# Patient Record
Sex: Female | Born: 1975 | Race: White | Hispanic: No | Smoking: Former smoker
Health system: Southern US, Community
[De-identification: ages and names within clinical notes are randomized; demographics above are authoritative.]

## PROBLEM LIST (undated history)

## (undated) DIAGNOSIS — E669 Obesity, unspecified: Secondary | ICD-10-CM

## (undated) DIAGNOSIS — R7303 Prediabetes: Secondary | ICD-10-CM

## (undated) HISTORY — DX: Obesity, unspecified: E66.9

## (undated) HISTORY — PX: CHOLECYSTECTOMY: SHX55

---

## 2013-01-15 HISTORY — PX: HEMORRHOID SURGERY: SHX153

## 2018-06-04 ENCOUNTER — Other Ambulatory Visit (HOSPITAL_COMMUNITY): Payer: Self-pay | Admitting: General Surgery

## 2018-06-04 ENCOUNTER — Other Ambulatory Visit: Payer: Self-pay | Admitting: General Surgery

## 2018-06-11 ENCOUNTER — Other Ambulatory Visit (HOSPITAL_COMMUNITY): Payer: Self-pay | Admitting: General Surgery

## 2018-06-11 ENCOUNTER — Ambulatory Visit (HOSPITAL_COMMUNITY)
Admission: RE | Admit: 2018-06-11 | Discharge: 2018-06-11 | Disposition: A | Discharge status: Home / Self Care | Payer: BLUE CROSS/BLUE SHIELD | Source: Ambulatory Visit | Attending: General Surgery | Admitting: General Surgery

## 2018-06-11 ENCOUNTER — Other Ambulatory Visit: Payer: Self-pay

## 2018-06-26 ENCOUNTER — Encounter: Payer: Self-pay | Admitting: Dietician

## 2018-06-26 ENCOUNTER — Other Ambulatory Visit: Payer: Self-pay

## 2018-06-26 ENCOUNTER — Encounter: Payer: BC Managed Care – PPO | Attending: General Surgery | Admitting: Dietician

## 2018-06-26 DIAGNOSIS — E669 Obesity, unspecified: Secondary | ICD-10-CM | POA: Insufficient documentation

## 2018-06-26 NOTE — Progress Notes (Signed)
Bariatric Pre-Op Nutrition Assessment Medical Nutrition Therapy  Appt Start Time: 7:30am  End time: 8:30am  Patient was seen on 06/26/2018 for Pre-Operative Nutrition Assessment. Assessment and letter of approval faxed to [facility] Surgery Bariatric Surgery Program coordinator on 06/26/2018.   Planned surgery: Sleeve Gastrectomy Pt expectation of surgery: to maintain weight that is lost  Pt expectation of dietitian: none stated  Anthropometrics  Start weight at [facility]: 265.4 lbs (date: 06/26/2018) Height: 62 in BMI: 48.54 kg/m2    Clinical  Medical Hx: obesity Surgeries: c-section, cholecystectomy, hemorrhoidectomy, oral surgery  Medications: N/A Allergies: N/A  Psychosocial/Lifestyle Former smoker, quit <2 weeks ago. Lives with her 90 year old daughter and 75 year old adopted daughter. Considered weight loss surgery last year as well.   24-Hr Dietary Recall First Meal: Chex mix (or leftovers)  Snack: nuts + chips (or cookie)  Second Meal: sandwich + fruit + chips  Snack: Cheetos  Third Meal: chicken + rice/potato + vegetable  Snack: ice cream (or dessert)  Beverages: Crystal Light tea, diet Mtn Dew   Food & Nutrition Related Hx Dietary Hx: Drinks almond milk instead of cow's. Not a breakfast food person. Snacks a lot throughout the day. Estimated Daily Fluid Intake: 64 oz Supplements: vitamin D  GI / Other Notable Symptoms: none stated  Physical Activity  Current average weekly physical activity: walking 20 mins/day   Estimated Energy Needs Calories: 1800 Carbohydrate: 200g Protein: 113g Fat: 60g  Pre-Op Goals Reviewed with the Patient . Track food and beverage intake (try MyFitness Pal or the Baritastic app) . Make healthy food choices while monitoring portion sizes . Consume 3 meals per day or try to eat every 3-5 hours . Avoid concentrated sugars and fried foods . Keep sugar & fat in the single digits per serving on food labels . Practice CHEWING your food  (aim for applesauce consistency) . Practice not drinking 15 minutes before, during, and 30 minutes after each meal and snack . Avoid all carbonated beverages (ex: soda, sparkling beverages)  . Limit caffeinated beverages (ex: coffee, tea, energy drinks) . Avoid all sugar-sweetened beverages (ex: regular soda, sports drinks)  . Avoid alcohol  . Aim for 64-100 ounces of FLUID daily (with at least half of fluid intake being plain water)  . Aim for at least 60-80 grams of PROTEIN daily . Look for a liquid protein source that contains ?15 g protein and ?5 g carbohydrate (ex: shakes, drinks, shots) . Make a list of non-food related activities . Physical activity is an important part of a healthy lifestyle so keep it moving! The goal is to reach 150 minutes of exercise per week, including cardiovascular and weight baring activity.  Handouts Provided Include  . Bariatric Surgery handouts (Nutrition Visits, Pre-Op Goals, Protein Shakes, Vitamins & Minerals)  Learning Style & Readiness for Change Teaching method utilized: Visual & Auditory  Demonstrated degree of understanding via: Teach Back  Barriers to learning/adherence to lifestyle change: None Identified   Next Steps Supervised Weight Loss (SWL) Visits Needed: 0  Patient is to call [facility] to enroll in Pre-Op Class (>2 weeks before surgery) and Post-Op Class (2 weeks after surgery) for further nutrition education when surgery date is scheduled.

## 2018-06-26 NOTE — Patient Instructions (Signed)
It was nice meeting you today! Begin working through the [employer] we discussed today. Start with the 1 or 2 goals you expect will be the hardest to work on first for the next couple of weeks. Then move towards working through other goals.   We will see you at Pre-Op Class 2 weeks before your surgery! Take care

## 2018-07-28 ENCOUNTER — Ambulatory Visit: Payer: BC Managed Care – PPO

## 2018-08-04 ENCOUNTER — Encounter: Payer: BC Managed Care – PPO | Attending: General Surgery | Admitting: Skilled Nursing Facility1

## 2018-08-04 ENCOUNTER — Other Ambulatory Visit: Payer: Self-pay

## 2018-08-04 DIAGNOSIS — E669 Obesity, unspecified: Secondary | ICD-10-CM | POA: Diagnosis not present

## 2018-08-05 NOTE — Progress Notes (Signed)
Pre-Operative Nutrition Class:  Appt start time: 3374   End time:  1830.  Patient was seen on 08/04/2018 for Pre-Operative [facility]ric [facility]   Surgery date: 08/18/2018 Surgery type: sleeve Start weight at [facility]: 265.4 Weight today: pt arrived too late   The following the learning objectives were met by the patient during this course:  Identify Pre-Op Dietary Goals and will begin 2 weeks pre-operatively  Identify appropriate sources of fluids and proteins   State protein recommendations and appropriate sources pre and post-operatively  Identify Post-Operative Dietary Goals and will follow for 2 weeks post-operatively  Identify appropriate multivitamin and calcium sources  Describe the need for physical activity post-operatively and will follow MD recommendations  State when to call healthcare provider regarding medication questions or post-operative complications  Handouts given during class include:  Pre-Op Bariatric Surgery Diet Handout  Protein Shake Handout  Post-Op Bariatric Surgery Nutrition Handout  [facility] Program Information Flyer  Support Group Information Flyer  [facility] Bariatric Supplements Price List  Follow-Up Plan: Patient will follow-up at [facility] 2 weeks post operatively for diet advancement per MD.

## 2018-08-13 NOTE — Progress Notes (Signed)
Please provide surgical orders. Pt is scheduled for PAT appointment on 08-15-18. Thank you

## 2018-08-13 NOTE — Progress Notes (Signed)
06-11-18 ( Epic) EKG, CXR

## 2018-08-13 NOTE — Patient Instructions (Addendum)
YOU HAVE HAD A COVID 19 TEST.  PLEASE CONTINUE THE QUARANTINE INSTRUCTIONS AS OUTLINED IN YOUR HANDOUT.                Valerie Lewis  08/13/2018   Your procedure is scheduled on: 08-18-18    Report to [facility] Main  Entrance    Report to Admitting at 7:45 AM   1 VISITOR IS ALLOWED TO WAIT IN WAITING ROOM  ONLY DAY OF YOUR SURGERY.    Call this number if you have problems the morning of surgery [phone]    Remember: Do not eat food or drink liquids :After Midnight.     Take these medicines the morning of surgery with A SIP OF WATER: None   BRUSH YOUR TEETH MORNING OF SURGERY AND RINSE YOUR MOUTH OUT, NO CHEWING GUM CANDY OR MINTS.                                You may not have any metal on your body including hair pins and              piercings     Do not wear jewelry, make-up, lotions, powders or perfumes, deodorant              Do not wear nail polish.  Do not shave  48 hours prior to surgery.              Do not bring valuables to the hospital. [place].  Contacts, dentures or bridgework may not be worn into surgery.    Special Instructions: N/A              Please read over the following fact sheets you were given: _____________________________________________________________________             [facility] - Preparing for Surgery Before surgery, you can play an important role.  Because skin is not sterile, your skin needs to be as free of germs as possible.  You can reduce the number of germs on your skin by washing with CHG (chlorahexidine gluconate) soap before surgery.  CHG is an antiseptic cleaner which kills germs and bonds with the skin to continue killing germs even after washing. Please DO NOT use if you have an allergy to CHG or antibacterial soaps.  If your skin becomes reddened/irritated stop using the CHG and inform your nurse when you arrive at Short Stay. Do not shave (including  legs and underarms) for at least 48 hours prior to the first CHG shower.  You may shave your face/neck. Please follow these instructions carefully:  1.  Shower with CHG Soap the night before surgery and the  morning of Surgery.  2.  If you choose to wash your hair, wash your hair first as usual with your  normal  shampoo.  3.  After you shampoo, rinse your hair and body thoroughly to remove the  shampoo.                           4.  Use CHG as you would any other liquid soap.  You can apply chg directly  to the skin and wash  Gently with a scrungie or clean washcloth.  5.  Apply the CHG Soap to your body ONLY FROM THE NECK DOWN.   Do not use on face/ open                           Wound or open sores. Avoid contact with eyes, ears mouth and genitals (private parts).                       Wash face,  Genitals (private parts) with your normal soap.             6.  Wash thoroughly, paying special attention to the area where your surgery  will be performed.  7.  Thoroughly rinse your body with warm water from the neck down.  8.  DO NOT shower/wash with your normal soap after using and rinsing off  the CHG Soap.                9.  Pat yourself dry with a clean towel.            10.  Wear clean pajamas.            11.  Place clean sheets on your bed the night of your first shower and do not  sleep with pets. Day of Surgery : Do not apply any lotions/deodorants the morning of surgery.  Please wear clean clothes to the hospital/surgery center.  FAILURE TO FOLLOW THESE INSTRUCTIONS MAY RESULT IN THE CANCELLATION OF YOUR SURGERY PATIENT SIGNATURE_________________________________  NURSE SIGNATURE__________________________________  ________________________________________________________________________

## 2018-08-14 ENCOUNTER — Other Ambulatory Visit (HOSPITAL_COMMUNITY)
Admission: RE | Admit: 2018-08-14 | Discharge: 2018-08-14 | Disposition: A | Discharge status: Home / Self Care | Payer: BC Managed Care – PPO | Source: Ambulatory Visit | Attending: General Surgery | Admitting: General Surgery

## 2018-08-14 DIAGNOSIS — Z20828 Contact with and (suspected) exposure to other viral communicable diseases: Secondary | ICD-10-CM | POA: Diagnosis not present

## 2018-08-14 LAB — SARS CORONAVIRUS 2 (TAT 6-24 HRS): SARS Coronavirus 2: NEGATIVE

## 2018-08-15 ENCOUNTER — Ambulatory Visit: Payer: Self-pay | Admitting: General Surgery

## 2018-08-15 ENCOUNTER — Encounter (HOSPITAL_COMMUNITY)
Admission: RE | Admit: 2018-08-15 | Discharge: 2018-08-15 | Disposition: A | Discharge status: Home / Self Care | Payer: BC Managed Care – PPO | Source: Ambulatory Visit | Attending: General Surgery | Admitting: General Surgery

## 2018-08-15 ENCOUNTER — Other Ambulatory Visit: Payer: Self-pay

## 2018-08-15 ENCOUNTER — Encounter (HOSPITAL_COMMUNITY): Payer: Self-pay

## 2018-08-15 DIAGNOSIS — Z79899 Other long term (current) drug therapy: Secondary | ICD-10-CM | POA: Insufficient documentation

## 2018-08-15 DIAGNOSIS — E559 Vitamin D deficiency, unspecified: Secondary | ICD-10-CM | POA: Diagnosis not present

## 2018-08-15 DIAGNOSIS — R7303 Prediabetes: Secondary | ICD-10-CM | POA: Diagnosis not present

## 2018-08-15 DIAGNOSIS — E538 Deficiency of other specified B group vitamins: Secondary | ICD-10-CM | POA: Insufficient documentation

## 2018-08-15 DIAGNOSIS — Z01812 Encounter for preprocedural laboratory examination: Secondary | ICD-10-CM | POA: Diagnosis not present

## 2018-08-15 DIAGNOSIS — Z87891 Personal history of nicotine dependence: Secondary | ICD-10-CM | POA: Diagnosis not present

## 2018-08-15 DIAGNOSIS — Z6841 Body Mass Index (BMI) 40.0 and over, adult: Secondary | ICD-10-CM | POA: Diagnosis not present

## 2018-08-15 HISTORY — DX: Prediabetes: R73.03

## 2018-08-15 LAB — HEMOGLOBIN A1C
Hgb A1c MFr Bld: 6.6 % — ABNORMAL HIGH (ref 4.8–5.6)
Mean Plasma Glucose: 142.72 mg/dL

## 2018-08-15 LAB — CBC
HCT: 43 % (ref 36.0–46.0)
Hemoglobin: 13.6 g/dL (ref 12.0–15.0)
MCH: 28.5 pg (ref 26.0–34.0)
MCHC: 31.6 g/dL (ref 30.0–36.0)
MCV: 90.1 fL (ref 80.0–100.0)
Platelets: 344 10*3/uL (ref 150–400)
RBC: 4.77 MIL/uL (ref 3.87–5.11)
RDW: 14.2 % (ref 11.5–15.5)
WBC: 11.3 10*3/uL — ABNORMAL HIGH (ref 4.0–10.5)
nRBC: 0 % (ref 0.0–0.2)

## 2018-08-15 LAB — BASIC METABOLIC PANEL
Anion gap: 9 (ref 5–15)
BUN: 16 mg/dL (ref 6–20)
CO2: 27 mmol/L (ref 22–32)
Calcium: 9.4 mg/dL (ref 8.9–10.3)
Chloride: 102 mmol/L (ref 98–111)
Creatinine, Ser: 0.79 mg/dL (ref 0.44–1.00)
GFR calc Af Amer: >60 mL/min (ref >60)
GFR calc non Af Amer: >60 mL/min (ref >60)
Glucose, Bld: 129 mg/dL — ABNORMAL HIGH (ref 70–99)
Potassium: 3.9 mmol/L (ref 3.5–5.1)
Sodium: 138 mmol/L (ref 135–145)

## 2018-08-15 NOTE — Progress Notes (Signed)
Pt's blood pressure reading discussed with Konrad Felix, PA Per Janett Billow, pt advised to contact PCP with reading to see if they recommend an antihypertensive medication. Pt also made aware that if BP reading is excessively high on the day of the procedure, could potentially be cancelled. Pt verbalized understanding.

## 2018-08-15 NOTE — Progress Notes (Signed)
Anesthesiology Note:  43 year old female with morbid obesity scheduled for gastric sleeve resection by Dr. Redmond Pulling at [facility] on 08/18/18. Patient  underwent R. lower molar extraction  in [place] approximately three weeks ago. Since then patient has had difficulty fully opening her mouth. She now can open it approximately 5 cm. I discussed these findings with Dr. 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 Dr. Berdine Addison could not give a give a definite time frame for how long it would be before  she could fully open her mouth.   I explained to the patient that the anesthesiologist assigned to  her case on Monday would assess her airway. The options for management include IV induction with muscle relaxation and to wake her up if the mouth would not open sufficiently for intubation or possible awake nasal fiberoptic intubation. I also explained that she could expect to have  increased jaw soreness after surgery.  She understood these concerns and agreed to proceed with surgery on 8/3.  Roberts Gaudy, MD

## 2018-08-15 NOTE — Anesthesia Preprocedure Evaluation (Addendum)
Anesthesia Evaluation  Patient identified by MRN, date of birth, ID band Patient awake    Reviewed: Allergy & Precautions, NPO status , Patient's Chart, lab work & pertinent test results  History of Anesthesia Complications Negative for: history of anesthetic complications  Airway Mallampati: III  TM Distance: >3 FB Neck ROM: Full  Mouth opening: Pediatric Airway  Dental  (+) Dental Advisory Given, Missing   Pulmonary former smoker,    Pulmonary exam normal breath sounds clear to auscultation       Cardiovascular hypertension, Pt. on medications (-) anginaNormal cardiovascular exam Rhythm:Regular Rate:Normal     Neuro/Psych negative neurological ROS  negative psych ROS   GI/Hepatic negative GI ROS, Neg liver ROS,   Endo/Other  Morbid obesity  Renal/GU negative Renal ROS     Musculoskeletal negative musculoskeletal ROS (+)   Abdominal   Peds  Hematology negative hematology ROS (+)   Anesthesia Other Findings Day of surgery medications reviewed with the patient.  Reproductive/Obstetrics negative OB ROS                           Anesthesia Physical Anesthesia Plan  ASA: III  Anesthesia Plan: General   Post-op Pain Management:    Induction: Intravenous  PONV Risk Score and Plan: 4 or greater and Midazolam, Dexamethasone, Ondansetron, Scopolamine patch - Pre-op and Diphenhydramine  Airway Management Planned: Oral ETT and Video Laryngoscope Planned  Additional Equipment:   Intra-op Plan:   Post-operative Plan: Extubation in OR  Informed Consent: I have reviewed the patients History and Physical, chart, labs and discussed the procedure including the risks, benefits and alternatives for the proposed anesthesia with the patient or authorized representative who has indicated his/her understanding and acceptance.     Dental advisory given  Plan Discussed with: CRNA  Anesthesia  Plan Comments: (Limited mouth opening s/p tooth extraction 3 weeks ago.  Discussed intubation options. Will plan for oral ETT with glidescope, backup nasal intubation.)      Anesthesia Quick Evaluation

## 2018-08-15 NOTE — H&P (Signed)
[name] Documented: 07/10/2018 11:08 AM Location: [facility] Patient #: [id number] DOB: 1975/04/28 Single / Language: Valerie Lewis / Race: White Female   History of Present Illness Randall Hiss M. Sterling Ucci MD; 07/10/2018 12:12 PM) The patient is a 43 year old female who presents for a bariatric surgery evaluation. She comes in for long-term follow-up regarding her severe obesity. I initially met her in February 2019 to discuss weight loss surgery. She denies any medical changes since then other than being diagnosed with prediabetes and hypertriglyceridemia. She had some neck pain and had a CT scan of her neck but it was negative. She had had a problem with a tooth. She denies any chest pain, chest pressure, chest tightness, angina, source of breath, orthopnea, peripheral edema. She was able to ask her mom about her mother's blood clot. It occurred after her mother had surgery. There is no other family history of blood clots. She continues not to smoke. She denies any heartburn today. Her doctor did start her on vitamin D supplementation once a week. She is also taking a multivitamin with B12  She had a chest x-ray an upper GI which were unremarkable. Labs in April 2020 showed a triglyceride level of 245, total cholesterol level 163, HDL level of 27. Comprehensive metabolic panel normal. CBC normal. A1c 6.2. Vitamin D low at 18.5. Vitamin B12 level low at 155. H. pylori negative  02/20/2017 She comes in to discuss weight loss surgery. She completed our Neurosurgeon. She is interested in a sleeve gastrectomy. She believes to gastric bypasses to involve and she is not interested in having injections with the lap band. Despite numerous attempts for sustained weight loss she has been unsuccessful. She has tried adipex multiple times along with topiramate, atkins, weight watchers- all without any long-term success.  She denies any chest pain, chest pressure, chest tightness, angina,  shortness of breath, dyspnea on exertion, orthopnea, peripheral edema. She states that her mom is on chronic blood thinners due to a spontaneous DVT. She is unaware of her mother had genetic workup. She has nighttime heartburn that is controlled with Tums. She does eat and drink most nights within 20 minutes at that time. Chocolate will make it worse. She has had a laparoscopic cholecystectomy and a C-section. She denies any abdominal pain. She denies any melena or hematochezia. She has daily bowel movements. She has regular and stroke.. She denies any dysuria or hematuria. She does have bilateral knee pain. She denies any TIAs or amaurosis fugax. She denies any history of diabetes. She is a former smoker. She quit 6 months ago. She denies any alcohol or drug use. She works as a Marine scientist at [employer]  Her primary care physician's office in [place] closed and hasn't seen a primary care doctor since then. There affiliate office is [facility]   Problem List/Past Medical Randall Hiss M. Redmond Pulling, MD; 07/10/2018 12:15 PM) HISTORY OF TOBACCO USE (Z87.891)  PREDIABETES (R73.03)  BILATERAL CHRONIC KNEE PAIN (M25.561, M25.562)  HYPERTRIGLYCERIDEMIA (E78.1)  VITAMIN D DEFICIENCY (E55.9)  MORBID OBESITY WITH BMI OF 50.0-59.9, ADULT (E66.01)  FAMILY HISTORY OF DVT (Z82.49)  VITAMIN B12 DEFICIENCY (E53.8)   Past Surgical History Randall Hiss M. Redmond Pulling, MD; 07/10/2018 12:15 PM) Cesarean Section - 1  Gallbladder Surgery - Laparoscopic  Hemorrhoidectomy  Oral Surgery   Diagnostic Studies History Randall Hiss M. Redmond Pulling, MD; 07/10/2018 12:15 PM) Colonoscopy  never Mammogram  never Pap Smear  >5 years ago  Allergies (Tanisha A. Owens Shark, [place]; 07/10/2018 11:09 AM) No  Known Drug Allergies  [02/20/2017]: Allergies Reconciled   Medication History Randall Hiss M. Redmond Pulling, MD; 07/10/2018 12:15 PM) Vitamin D (Ergocalciferol) (1.25 MG(50000 UT) Capsule, Oral) Active. Medications Reconciled No  Current Medications  Social History Randall Hiss M. Redmond Pulling, MD; 07/10/2018 12:15 PM) Caffeine use  Carbonated beverages, Coffee. No alcohol use  No drug use  Tobacco use  Former smoker.  Pregnancy / Birth History Randall Hiss M. Redmond Pulling, MD; 07/10/2018 12:15 PM) Age at menarche  27 years. Gravida  1 Length (months) of breastfeeding  12-24 Maternal age  56-30 Para  1 Regular periods   Other Problems Randall Hiss M. Redmond Pulling, MD; 07/10/2018 12:15 PM) Cholelithiasis  Depression  Hemorrhoids  GASTROESOPHAGEAL REFLUX DISEASE, ESOPHAGITIS PRESENCE NOT SPECIFIED     Review of Systems Randall Hiss M. Siri Buege MD; 07/10/2018 12:12 PM) All other systems negative  Vitals (Tanisha A. Brown RMA; 07/10/2018 11:09 AM) 07/10/2018 11:08 AM Weight: 276 lb Height: 61.5in Body Surface Area: 2.18 m Body Mass Index: 51.3 kg/m  Temp.: 98.38F  Pulse: 98 (Regular)  BP: 136/92(Sitting, Left Arm, Standard)       Physical Exam Randall Hiss M. Shilo Pauwels MD; 07/10/2018 12:12 PM) General Mental Status-Alert. General Appearance-Consistent with stated age. Hydration-Well hydrated. Voice-Normal.  Head and Neck Head-normocephalic, atraumatic with no lesions or palpable masses. Trachea-midline. Thyroid Gland Characteristics - normal size and consistency.  Eye Eyeball - Bilateral-Normal. Sclera/Conjunctiva - Bilateral-No scleral icterus.  ENMT Ears -Note: normal external ears.  Mouth and Throat -Note: lips intact.   Chest and Lung Exam Chest and lung exam reveals -quiet, even and easy respiratory effort with no use of accessory muscles and on auscultation, normal breath sounds, no adventitious sounds and normal vocal resonance. Inspection Chest Wall - Normal. Back - normal.  Breast - Did not examine.  Cardiovascular Cardiovascular examination reveals -normal heart sounds, regular rate and rhythm with no murmurs and normal pedal pulses  bilaterally.  Abdomen Inspection Inspection of the abdomen reveals - No Hernias. Skin - Scar - Note: well healed trocar scars. Palpation/Percussion Palpation and Percussion of the abdomen reveal - Soft, Non Tender, No Rebound tenderness, No Rigidity (guarding) and No hepatosplenomegaly. Auscultation Auscultation of the abdomen reveals - Bowel sounds normal.  Peripheral Vascular Upper Extremity Palpation - Pulses bilaterally normal.  Neurologic Neurologic evaluation reveals -alert and oriented x 3 with no impairment of recent or remote memory. Mental Status-Normal.  Neuropsychiatric The patient's mood and affect are described as -normal. Judgment and Insight-insight is appropriate concerning matters relevant to self.  Musculoskeletal Normal Exam - Left-Upper Extremity Strength Normal and Lower Extremity Strength Normal. Normal Exam - Right-Upper Extremity Strength Normal and Lower Extremity Strength Normal.  Lymphatic Head & Neck  General Head & Neck Lymphatics: Bilateral - Description - Normal. Axillary - Did not examine. Femoral & Inguinal - Did not examine.    Assessment & Plan Randall Hiss M. Donyae Kilner MD; 07/10/2018 12:15 PM)  MORBID OBESITY WITH BMI OF 50.0-59.9, ADULT (E66.01) Impression: The patient meets weight loss surgery criteria. I think the patient would be an acceptable candidate for Laparoscopic vertical sleeve gastrectomy.  We rediscussed LSG. We discussed the preoperative, operative and postoperative process. Using diagrams, I explained the surgery in detail including the performance of an EGD near the end of the surgery. We discussed the typical hospital course including a 2-3 day stay baring any complications. We discussed the typical recovery course. We discussed the typical issues that we see after surgery. We had previously discussed potential risk and complications extensively during her first visit. I offered to  rediscuss these risks but she states  that she did not feel she needed to since she remembers that conversation and has done additional research since then  We reviewed her workup. We discussed sometimes the finding of an incidental hiatal hernia during surgery even if a negative upper GI preoperatively. We discussed that if we found 1 we would repair it. All of her questions were asked and answered  Current Plans Pt Education - EMW_preopbariatric  BILATERAL CHRONIC KNEE PAIN (M25.561)   FAMILY HISTORY OF DVT (Z82.49) Impression: Her mom had a blood clot after she underwent surgery. Otherwise negative family history of blood clots   HISTORY OF TOBACCO USE (Z87.891)   PREDIABETES (R73.03)   HYPERTRIGLYCERIDEMIA (E78.1)   VITAMIN D DEFICIENCY (E55.9) Impression: Continue vitamin D supplementation per PCP   VITAMIN B12 DEFICIENCY (E53.8) Impression: We discussed the importance of continuing to take her B12 supplement  Addendum Note(Zamiyah Resendes M. Ivorie Uplinger MD; 07/10/2018 12:18 PM) also dx with HTN and started on lisinopril  Leighton Ruff. Redmond Pulling, MD, FACS General, Bariatric, & Minimally Invasive Surgery [facility], [place]

## 2018-08-15 NOTE — Progress Notes (Signed)
Anesthesia Chart Review   Case: [id number] Date/Time: 08/18/18 0933   Procedure: LAPAROSCOPIC GASTRIC SLEEVE RESECTION, Upper Endo, ERAS Pathway (N/A )   Anesthesia type: General   Pre-op diagnosis: Morbid Obesity, Pre Diabetes, Vitamin D Def, Vit b12 Def,   Location: [facility] ROOM 02 / [facility]   Surgeon: [name], MD      [facility].o. former smoker (10 pack years, quit 06/14/2018) with h/o morbid obesity, pre-diabetes scheduled for above procedure 08/18/2018 with Dr. [name].   Pt reports right lower molar extraction three weeks ago.  Reports since that time she has had difficulty opening her mouth.  She reports her dentist told her this could take a few months to resolve.  Evaluated by Dr. Linna Caprice in PAT.  Dr. 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  Dr. Linna Caprice will speak with Dr. [name] about proceeding.  Dr. Berdine Addison recommended muscle relaxers and possible steroid use.    Pt with elevated PT at PAT visit.  She reports it is typically not that elevated.  No diagnosis of HTN.  Pt advised to contact PCP.  Will evaluate DOS.   VS: BP (!) 171/105   Pulse 100   Temp 36.7 C (Oral)   Resp 18   Ht 5' 1"  (1.549 m)   Wt 123.7 kg   SpO2 99%   BMI 51.51 kg/m   PROVIDERS: [facility], [facility]   LABS: Labs reviewed: Acceptable for surgery. (all labs ordered are listed, but only abnormal results are displayed)  Labs Reviewed  CBC - Abnormal; Notable for the following components:      Result Value   WBC 11.3 (*)    All other components within normal limits  BASIC METABOLIC PANEL - Abnormal; Notable for the following components:   Glucose, Bld 129 (*)    All other components within normal limits  HEMOGLOBIN A1C - Abnormal; Notable for the following components:   Hgb A1c MFr Bld 6.6 (*)    All other components within normal limits     IMAGES: Chest Xray 06/11/2018 FINDINGS: Heart and mediastinal  contours are within normal limits. No focal opacities or effusions. No acute bony abnormality.  IMPRESSION: No active cardiopulmonary disease.  EKG: 06/11/2018 Rate 89 bpm Normal sinus rhythm Inferior infarct , age undetermined Anterior infarct , age undetermined No old tracing to compare  CV:  Past Medical History:  Diagnosis Date  . Obesity   . Pre-diabetes     Past Surgical History:  Procedure Laterality Date  . CESAREAN SECTION    . CHOLECYSTECTOMY      MEDICATIONS: . ergocalciferol (VITAMIN D2) 1.25 MG (50000 UT) capsule  . vitamin B-12 (CYANOCOBALAMIN) 1000 MCG tablet   No current facility-administered medications for this encounter.      Maia Plan [facility] Pre-Surgical Testing [phone] 08/15/18  1:46 PM

## 2018-08-17 MED ORDER — GENTAMICIN SULFATE 40 MG/ML IJ SOLN
5.0000 mg/kg | INTRAVENOUS | Status: AC
  Administered 2018-08-18: 391.2 mg via INTRAVENOUS
  Filled 2018-08-17 (×2): qty 9.75

## 2018-08-17 MED ORDER — BUPIVACAINE LIPOSOME 1.3 % IJ SUSP
20.0000 mL | Freq: Once | INTRAMUSCULAR | Status: DC
  Filled 2018-08-17: qty 20

## 2018-08-18 ENCOUNTER — Inpatient Hospital Stay (HOSPITAL_COMMUNITY): Payer: BC Managed Care – PPO | Admitting: Physician Assistant

## 2018-08-18 ENCOUNTER — Encounter (HOSPITAL_COMMUNITY)
Admission: RE | Disposition: A | Discharge status: Home / Self Care | Payer: Self-pay | Source: Home / Self Care | Attending: General Surgery

## 2018-08-18 ENCOUNTER — Inpatient Hospital Stay (HOSPITAL_COMMUNITY)
Admission: RE | Admit: 2018-08-18 | Discharge: 2018-08-20 | DRG: 621 | Disposition: A | Discharge status: Home / Self Care | Payer: BC Managed Care – PPO | Attending: General Surgery | Admitting: General Surgery

## 2018-08-18 ENCOUNTER — Inpatient Hospital Stay (HOSPITAL_COMMUNITY): Payer: BC Managed Care – PPO | Admitting: Anesthesiology

## 2018-08-18 ENCOUNTER — Other Ambulatory Visit: Payer: Self-pay

## 2018-08-18 ENCOUNTER — Encounter (HOSPITAL_COMMUNITY): Payer: Self-pay | Admitting: *Deleted

## 2018-08-18 DIAGNOSIS — Z8249 Family history of ischemic heart disease and other diseases of the circulatory system: Secondary | ICD-10-CM

## 2018-08-18 DIAGNOSIS — E538 Deficiency of other specified B group vitamins: Secondary | ICD-10-CM | POA: Diagnosis present

## 2018-08-18 DIAGNOSIS — Z9049 Acquired absence of other specified parts of digestive tract: Secondary | ICD-10-CM

## 2018-08-18 DIAGNOSIS — R200 Anesthesia of skin: Secondary | ICD-10-CM | POA: Diagnosis not present

## 2018-08-18 DIAGNOSIS — Z79899 Other long term (current) drug therapy: Secondary | ICD-10-CM | POA: Diagnosis not present

## 2018-08-18 DIAGNOSIS — M25562 Pain in left knee: Secondary | ICD-10-CM | POA: Diagnosis present

## 2018-08-18 DIAGNOSIS — I10 Essential (primary) hypertension: Secondary | ICD-10-CM | POA: Diagnosis present

## 2018-08-18 DIAGNOSIS — E559 Vitamin D deficiency, unspecified: Secondary | ICD-10-CM | POA: Diagnosis present

## 2018-08-18 DIAGNOSIS — G8929 Other chronic pain: Secondary | ICD-10-CM | POA: Diagnosis present

## 2018-08-18 DIAGNOSIS — M25561 Pain in right knee: Secondary | ICD-10-CM | POA: Diagnosis present

## 2018-08-18 DIAGNOSIS — R7303 Prediabetes: Secondary | ICD-10-CM | POA: Diagnosis present

## 2018-08-18 DIAGNOSIS — Z87891 Personal history of nicotine dependence: Secondary | ICD-10-CM | POA: Diagnosis not present

## 2018-08-18 DIAGNOSIS — F329 Major depressive disorder, single episode, unspecified: Secondary | ICD-10-CM | POA: Diagnosis present

## 2018-08-18 DIAGNOSIS — E781 Pure hyperglyceridemia: Secondary | ICD-10-CM | POA: Diagnosis present

## 2018-08-18 DIAGNOSIS — K649 Unspecified hemorrhoids: Secondary | ICD-10-CM | POA: Diagnosis present

## 2018-08-18 DIAGNOSIS — Z6841 Body Mass Index (BMI) 40.0 and over, adult: Secondary | ICD-10-CM

## 2018-08-18 DIAGNOSIS — R609 Edema, unspecified: Secondary | ICD-10-CM | POA: Diagnosis not present

## 2018-08-18 DIAGNOSIS — R05 Cough: Secondary | ICD-10-CM

## 2018-08-18 DIAGNOSIS — R059 Cough, unspecified: Secondary | ICD-10-CM

## 2018-08-18 DIAGNOSIS — Z9884 Bariatric surgery status: Secondary | ICD-10-CM

## 2018-08-18 HISTORY — PX: LAPAROSCOPIC GASTRIC SLEEVE RESECTION: SHX5895

## 2018-08-18 LAB — TYPE AND SCREEN
ABO/RH(D): A POS
Antibody Screen: NEGATIVE

## 2018-08-18 LAB — GLUCOSE, CAPILLARY
Glucose-Capillary: 147 mg/dL — ABNORMAL HIGH (ref 70–99)
Glucose-Capillary: 152 mg/dL — ABNORMAL HIGH (ref 70–99)

## 2018-08-18 LAB — HEMOGLOBIN AND HEMATOCRIT, BLOOD
HCT: 41 % (ref 36.0–46.0)
Hemoglobin: 12.8 g/dL (ref 12.0–15.0)

## 2018-08-18 LAB — PREGNANCY, URINE: Preg Test, Ur: NEGATIVE

## 2018-08-18 LAB — ABO/RH: ABO/RH(D): A POS

## 2018-08-18 SURGERY — GASTRECTOMY, SLEEVE, LAPAROSCOPIC
Anesthesia: General | Site: Abdomen

## 2018-08-18 MED ORDER — PROMETHAZINE HCL 25 MG/ML IJ SOLN: 12.5000 mg | Freq: Four times a day (QID) | INTRAMUSCULAR | Status: DC | PRN

## 2018-08-18 MED ORDER — MORPHINE SULFATE (PF) 4 MG/ML IV SOLN
1.0000 mg | INTRAVENOUS | Status: DC | PRN
  Administered 2018-08-18: 2 mg via INTRAVENOUS
  Filled 2018-08-18: qty 1

## 2018-08-18 MED ORDER — HYDRALAZINE HCL 20 MG/ML IJ SOLN: 10.0000 mg | INTRAMUSCULAR | Status: DC | PRN

## 2018-08-18 MED ORDER — PROMETHAZINE HCL 25 MG/ML IJ SOLN
6.2500 mg | INTRAMUSCULAR | Status: AC | PRN
  Administered 2018-08-18 (×2): 6.25 mg via INTRAVENOUS

## 2018-08-18 MED ORDER — BUPIVACAINE LIPOSOME 1.3 % IJ SUSP
INTRAMUSCULAR | Status: DC | PRN
  Administered 2018-08-18: 20 mL

## 2018-08-18 MED ORDER — LACTATED RINGERS IV SOLN
INTRAVENOUS | Status: DC
  Administered 2018-08-18: 09:00:00 via INTRAVENOUS

## 2018-08-18 MED ORDER — DEXAMETHASONE SODIUM PHOSPHATE 10 MG/ML IJ SOLN
INTRAMUSCULAR | Status: AC
  Filled 2018-08-18: qty 1

## 2018-08-18 MED ORDER — DEXAMETHASONE SODIUM PHOSPHATE 4 MG/ML IJ SOLN: 4.0000 mg | INTRAMUSCULAR | Status: DC

## 2018-08-18 MED ORDER — FENTANYL CITRATE (PF) 100 MCG/2ML IJ SOLN
INTRAMUSCULAR | Status: AC
  Filled 2018-08-18: qty 2

## 2018-08-18 MED ORDER — LACTATED RINGERS IR SOLN
Status: DC | PRN
  Administered 2018-08-18: 1000 mL

## 2018-08-18 MED ORDER — OXYCODONE HCL 5 MG/5ML PO SOLN
5.0000 mg | Freq: Four times a day (QID) | ORAL | Status: DC | PRN
  Administered 2018-08-18 – 2018-08-19 (×4): 5 mg via ORAL
  Filled 2018-08-18 (×4): qty 5

## 2018-08-18 MED ORDER — ONDANSETRON HCL 4 MG/2ML IJ SOLN
INTRAMUSCULAR | Status: DC | PRN
  Administered 2018-08-18: 4 mg via INTRAVENOUS

## 2018-08-18 MED ORDER — PROPOFOL 10 MG/ML IV BOLUS
INTRAVENOUS | Status: AC
  Filled 2018-08-18: qty 20

## 2018-08-18 MED ORDER — ONDANSETRON HCL 4 MG/2ML IJ SOLN
INTRAMUSCULAR | Status: AC
  Filled 2018-08-18: qty 2

## 2018-08-18 MED ORDER — HYDROMORPHONE HCL 1 MG/ML IJ SOLN
0.5000 mg | Freq: Once | INTRAMUSCULAR | Status: AC
  Administered 2018-08-18: 0.5 mg via INTRAVENOUS

## 2018-08-18 MED ORDER — SUCCINYLCHOLINE CHLORIDE 200 MG/10ML IV SOSY
PREFILLED_SYRINGE | INTRAVENOUS | Status: AC
  Filled 2018-08-18: qty 10

## 2018-08-18 MED ORDER — ENSURE MAX PROTEIN PO LIQD
2.0000 [oz_av] | ORAL | Status: DC
  Administered 2018-08-19 – 2018-08-20 (×10): 2 [oz_av] via ORAL

## 2018-08-18 MED ORDER — HYDROMORPHONE HCL 1 MG/ML IJ SOLN
INTRAMUSCULAR | Status: AC
  Filled 2018-08-18: qty 1

## 2018-08-18 MED ORDER — SIMETHICONE 80 MG PO CHEW: 80.0000 mg | CHEWABLE_TABLET | Freq: Four times a day (QID) | ORAL | Status: DC | PRN

## 2018-08-18 MED ORDER — HYDROMORPHONE HCL 1 MG/ML IJ SOLN
1.0000 mg | Freq: Once | INTRAMUSCULAR | Status: AC
  Administered 2018-08-18: 1 mg via INTRAVENOUS

## 2018-08-18 MED ORDER — PROMETHAZINE HCL 25 MG/ML IJ SOLN
INTRAMUSCULAR | Status: AC
  Filled 2018-08-18: qty 1

## 2018-08-18 MED ORDER — LISINOPRIL 10 MG PO TABS: 10.0000 mg | ORAL_TABLET | Freq: Every day | ORAL | Status: DC | PRN

## 2018-08-18 MED ORDER — INSULIN ASPART 100 UNIT/ML ~~LOC~~ SOLN: 0.0000 [IU] | SUBCUTANEOUS | Status: DC

## 2018-08-18 MED ORDER — SUGAMMADEX SODIUM 500 MG/5ML IV SOLN
INTRAVENOUS | Status: AC
  Filled 2018-08-18: qty 5

## 2018-08-18 MED ORDER — ONDANSETRON HCL 4 MG/2ML IJ SOLN
4.0000 mg | Freq: Four times a day (QID) | INTRAMUSCULAR | Status: DC | PRN
  Administered 2018-08-18 – 2018-08-20 (×5): 4 mg via INTRAVENOUS
  Filled 2018-08-18 (×5): qty 2

## 2018-08-18 MED ORDER — SODIUM CHLORIDE (PF) 0.9 % IJ SOLN
INTRAMUSCULAR | Status: AC
  Filled 2018-08-18: qty 50

## 2018-08-18 MED ORDER — FENTANYL CITRATE (PF) 100 MCG/2ML IJ SOLN
INTRAMUSCULAR | Status: DC | PRN
  Administered 2018-08-18: 100 ug via INTRAVENOUS

## 2018-08-18 MED ORDER — HEPARIN SODIUM (PORCINE) 5000 UNIT/ML IJ SOLN
5000.0000 [IU] | INTRAMUSCULAR | Status: AC
  Administered 2018-08-18: 5000 [IU] via SUBCUTANEOUS
  Filled 2018-08-18: qty 1

## 2018-08-18 MED ORDER — PANTOPRAZOLE SODIUM 40 MG IV SOLR
40.0000 mg | Freq: Every day | INTRAVENOUS | Status: DC
  Administered 2018-08-18 – 2018-08-19 (×2): 40 mg via INTRAVENOUS
  Filled 2018-08-18 (×2): qty 40

## 2018-08-18 MED ORDER — CHLORHEXIDINE GLUCONATE 4 % EX LIQD: 60.0000 mL | Freq: Once | CUTANEOUS | Status: DC

## 2018-08-18 MED ORDER — SODIUM CHLORIDE 0.9 % IV SOLN
INTRAVENOUS | Status: DC | PRN
  Administered 2018-08-18: 25 ug/min via INTRAVENOUS

## 2018-08-18 MED ORDER — ENOXAPARIN SODIUM 30 MG/0.3ML ~~LOC~~ SOLN
30.0000 mg | Freq: Two times a day (BID) | SUBCUTANEOUS | Status: DC
  Administered 2018-08-18 – 2018-08-20 (×4): 30 mg via SUBCUTANEOUS
  Filled 2018-08-18 (×4): qty 0.3

## 2018-08-18 MED ORDER — KETAMINE HCL 10 MG/ML IJ SOLN
INTRAMUSCULAR | Status: DC | PRN
  Administered 2018-08-18 (×2): 10 mg via INTRAVENOUS

## 2018-08-18 MED ORDER — GABAPENTIN 100 MG PO CAPS
200.0000 mg | ORAL_CAPSULE | Freq: Two times a day (BID) | ORAL | Status: DC
  Administered 2018-08-18 – 2018-08-20 (×4): 200 mg via ORAL
  Filled 2018-08-18 (×4): qty 2

## 2018-08-18 MED ORDER — ACETAMINOPHEN 160 MG/5ML PO SOLN
1000.0000 mg | Freq: Three times a day (TID) | ORAL | Status: DC
  Administered 2018-08-18 – 2018-08-20 (×5): 1000 mg via ORAL
  Filled 2018-08-18 (×5): qty 40.6

## 2018-08-18 MED ORDER — LIDOCAINE 2% (20 MG/ML) 5 ML SYRINGE
INTRAMUSCULAR | Status: DC | PRN
  Administered 2018-08-18: 100 mg via INTRAVENOUS

## 2018-08-18 MED ORDER — SUGAMMADEX SODIUM 200 MG/2ML IV SOLN
INTRAVENOUS | Status: DC | PRN
  Administered 2018-08-18: 300 mg via INTRAVENOUS

## 2018-08-18 MED ORDER — MIDAZOLAM HCL 2 MG/2ML IJ SOLN
INTRAMUSCULAR | Status: AC
  Filled 2018-08-18: qty 2

## 2018-08-18 MED ORDER — APREPITANT 40 MG PO CAPS
40.0000 mg | ORAL_CAPSULE | ORAL | Status: AC
  Administered 2018-08-18: 08:00:00 40 mg via ORAL
  Filled 2018-08-18: qty 1

## 2018-08-18 MED ORDER — ACETAMINOPHEN 500 MG PO TABS
1000.0000 mg | ORAL_TABLET | ORAL | Status: AC
  Administered 2018-08-18: 1000 mg via ORAL
  Filled 2018-08-18: qty 2

## 2018-08-18 MED ORDER — PROPOFOL 10 MG/ML IV BOLUS
INTRAVENOUS | Status: DC | PRN
  Administered 2018-08-18: 200 mg via INTRAVENOUS

## 2018-08-18 MED ORDER — MIDAZOLAM HCL 2 MG/2ML IJ SOLN
INTRAMUSCULAR | Status: DC | PRN
  Administered 2018-08-18: 2 mg via INTRAVENOUS

## 2018-08-18 MED ORDER — SODIUM CHLORIDE (PF) 0.9 % IJ SOLN
INTRAMUSCULAR | Status: DC | PRN
  Administered 2018-08-18: 50 mL

## 2018-08-18 MED ORDER — PHENYLEPHRINE HCL (PRESSORS) 10 MG/ML IV SOLN
INTRAVENOUS | Status: AC
  Filled 2018-08-18: qty 1

## 2018-08-18 MED ORDER — DEXAMETHASONE SODIUM PHOSPHATE 10 MG/ML IJ SOLN
INTRAMUSCULAR | Status: DC | PRN
  Administered 2018-08-18: 8 mg via INTRAVENOUS

## 2018-08-18 MED ORDER — LIDOCAINE HCL 2 % IJ SOLN
INTRAMUSCULAR | Status: AC
  Filled 2018-08-18: qty 20

## 2018-08-18 MED ORDER — FENTANYL CITRATE (PF) 100 MCG/2ML IJ SOLN
25.0000 ug | INTRAMUSCULAR | Status: DC | PRN
  Administered 2018-08-18: 25 ug via INTRAVENOUS
  Administered 2018-08-18: 50 ug via INTRAVENOUS
  Administered 2018-08-18: 25 ug via INTRAVENOUS
  Administered 2018-08-18: 50 ug via INTRAVENOUS

## 2018-08-18 MED ORDER — ACETAMINOPHEN 500 MG PO TABS
1000.0000 mg | ORAL_TABLET | Freq: Three times a day (TID) | ORAL | Status: DC
  Administered 2018-08-19: 1000 mg via ORAL
  Filled 2018-08-18 (×3): qty 2

## 2018-08-18 MED ORDER — SUCCINYLCHOLINE CHLORIDE 200 MG/10ML IV SOSY
PREFILLED_SYRINGE | INTRAVENOUS | Status: DC | PRN
  Administered 2018-08-18: 120 mg via INTRAVENOUS

## 2018-08-18 MED ORDER — PHENYLEPHRINE 40 MCG/ML (10ML) SYRINGE FOR IV PUSH (FOR BLOOD PRESSURE SUPPORT)
PREFILLED_SYRINGE | INTRAVENOUS | Status: DC | PRN
  Administered 2018-08-18 (×3): 120 ug via INTRAVENOUS

## 2018-08-18 MED ORDER — LIDOCAINE 2% (20 MG/ML) 5 ML SYRINGE
INTRAMUSCULAR | Status: DC | PRN
  Administered 2018-08-18: 1.5 mg/kg/h via INTRAVENOUS

## 2018-08-18 MED ORDER — ROCURONIUM BROMIDE 10 MG/ML (PF) SYRINGE
PREFILLED_SYRINGE | INTRAVENOUS | Status: DC | PRN
  Administered 2018-08-18: 10 mg via INTRAVENOUS
  Administered 2018-08-18: 50 mg via INTRAVENOUS
  Administered 2018-08-18: 10 mg via INTRAVENOUS

## 2018-08-18 MED ORDER — KETOROLAC TROMETHAMINE 15 MG/ML IJ SOLN: 15.0000 mg | Freq: Three times a day (TID) | INTRAMUSCULAR | Status: DC | PRN

## 2018-08-18 MED ORDER — GABAPENTIN 300 MG PO CAPS
300.0000 mg | ORAL_CAPSULE | ORAL | Status: AC
  Administered 2018-08-18: 300 mg via ORAL
  Filled 2018-08-18: qty 1

## 2018-08-18 MED ORDER — LIDOCAINE 2% (20 MG/ML) 5 ML SYRINGE
INTRAMUSCULAR | Status: AC
  Filled 2018-08-18: qty 5

## 2018-08-18 MED ORDER — POTASSIUM CHLORIDE IN NACL 20-0.9 MEQ/L-% IV SOLN
INTRAVENOUS | Status: DC
  Administered 2018-08-18 – 2018-08-20 (×5): via INTRAVENOUS
  Filled 2018-08-18 (×5): qty 1000

## 2018-08-18 MED ORDER — SCOPOLAMINE 1 MG/3DAYS TD PT72
1.0000 | MEDICATED_PATCH | TRANSDERMAL | Status: DC
  Administered 2018-08-18: 1.5 mg via TRANSDERMAL
  Filled 2018-08-18: qty 1

## 2018-08-18 SURGICAL SUPPLY — 77 items
APPLICATOR COTTON TIP 6 STRL (MISCELLANEOUS) IMPLANT
APPLICATOR COTTON TIP 6IN STRL (MISCELLANEOUS)
APPLIER CLIP ROT 10 11.4 M/L (STAPLE)
APPLIER CLIP ROT 13.4 12 LRG (CLIP)
BENZOIN TINCTURE PRP APPL 2/3 (GAUZE/BANDAGES/DRESSINGS) ×3 IMPLANT
BLADE SURG SZ11 CARB STEEL (BLADE) ×3 IMPLANT
BNDG ADH 1X3 SHEER STRL LF (GAUZE/BANDAGES/DRESSINGS) ×18 IMPLANT
CABLE HIGH FREQUENCY MONO STRZ (ELECTRODE) ×1 IMPLANT
CHLORAPREP W/TINT 26 (MISCELLANEOUS) ×6 IMPLANT
CLIP APPLIE ROT 10 11.4 M/L (STAPLE) IMPLANT
CLIP APPLIE ROT 13.4 12 LRG (CLIP) IMPLANT
CLOSURE WOUND 1/2 X4 (GAUZE/BANDAGES/DRESSINGS) ×1
COVER SURGICAL LIGHT HANDLE (MISCELLANEOUS) ×3 IMPLANT
COVER WAND RF STERILE (DRAPES) IMPLANT
DECANTER SPIKE VIAL GLASS SM (MISCELLANEOUS) ×3 IMPLANT
DEVICE SUT QUICK LOAD TK 5 (STAPLE) IMPLANT
DEVICE SUT TI-KNOT TK 5X26 (MISCELLANEOUS) IMPLANT
DEVICE SUTURE ENDOST 10MM (ENDOMECHANICALS) IMPLANT
DEVICE TI KNOT TK5 (MISCELLANEOUS)
DISSECTOR BLUNT TIP ENDO 5MM (MISCELLANEOUS) IMPLANT
DRAPE UTILITY XL STRL (DRAPES) ×6 IMPLANT
ELECT L-HOOK LAP 45CM DISP (ELECTROSURGICAL)
ELECT PENCIL ROCKER SW 15FT (MISCELLANEOUS) IMPLANT
ELECT REM PT RETURN 15FT ADLT (MISCELLANEOUS) ×3 IMPLANT
ELECTRODE L-HOOK LAP 45CM DISP (ELECTROSURGICAL) IMPLANT
GAUZE SPONGE 2X2 8PLY STRL LF (GAUZE/BANDAGES/DRESSINGS) IMPLANT
GAUZE SPONGE 4X4 12PLY STRL (GAUZE/BANDAGES/DRESSINGS) IMPLANT
GLOVE BIO SURGEON STRL SZ7.5 (GLOVE) ×3 IMPLANT
GLOVE INDICATOR 8.0 STRL GRN (GLOVE) ×3 IMPLANT
GOWN STRL REUS W/TWL XL LVL3 (GOWN DISPOSABLE) ×9 IMPLANT
GRASPER SUT TROCAR 14GX15 (MISCELLANEOUS) ×3 IMPLANT
HOVERMATT SINGLE USE (MISCELLANEOUS) ×3 IMPLANT
KIT BASIN OR (CUSTOM PROCEDURE TRAY) ×3 IMPLANT
KIT TURNOVER KIT A (KITS) IMPLANT
MARKER SKIN DUAL TIP RULER LAB (MISCELLANEOUS) ×3 IMPLANT
NDL SPNL 22GX3.5 QUINCKE BK (NEEDLE) ×1 IMPLANT
NEEDLE SPNL 22GX3.5 QUINCKE BK (NEEDLE) ×3 IMPLANT
PACK UNIVERSAL I (CUSTOM PROCEDURE TRAY) ×3 IMPLANT
PENCIL SMOKE EVACUATOR (MISCELLANEOUS) IMPLANT
QUICK LOAD TK 5 (STAPLE)
RELOAD STAPLE 60 3.6 BLU REG (STAPLE) ×1 IMPLANT
RELOAD STAPLE 60 3.8 GOLD REG (STAPLE) IMPLANT
RELOAD STAPLE 60 4.1 GRN THCK (STAPLE) ×1 IMPLANT
RELOAD STAPLE 60 BLK VRY/THCK (STAPLE) IMPLANT
RELOAD STAPLER 60MM BLK (STAPLE) IMPLANT
RELOAD STAPLER BLUE 60MM (STAPLE) ×3 IMPLANT
RELOAD STAPLER GOLD 60MM (STAPLE) ×1 IMPLANT
RELOAD STAPLER GREEN 60MM (STAPLE) ×2 IMPLANT
SCISSORS LAP 5X45 EPIX DISP (ENDOMECHANICALS) IMPLANT
SEALANT SURGICAL APPL DUAL CAN (MISCELLANEOUS) IMPLANT
SET IRRIG TUBING LAPAROSCOPIC (IRRIGATION / IRRIGATOR) ×3 IMPLANT
SET TUBE SMOKE EVAC HIGH FLOW (TUBING) ×3 IMPLANT
SHEARS HARMONIC ACE PLUS 45CM (MISCELLANEOUS) ×3 IMPLANT
SLEEVE GASTRECTOMY 40FR VISIGI (MISCELLANEOUS) ×3 IMPLANT
SLEEVE XCEL OPT CAN 5 100 (ENDOMECHANICALS) ×9 IMPLANT
SOLUTION ANTI FOG 6CC (MISCELLANEOUS) ×3 IMPLANT
SPONGE GAUZE 2X2 STER 10/PKG (GAUZE/BANDAGES/DRESSINGS)
SPONGE LAP 18X18 RF (DISPOSABLE) ×3 IMPLANT
STAPLER ECHELON BIOABSB 60 FLE (MISCELLANEOUS) ×14 IMPLANT
STAPLER ECHELON LONG 60 440 (INSTRUMENTS) ×3 IMPLANT
STAPLER RELOAD 60MM BLK (STAPLE)
STAPLER RELOAD BLUE 60MM (STAPLE) ×9
STAPLER RELOAD GOLD 60MM (STAPLE) ×3
STAPLER RELOAD GREEN 60MM (STAPLE) ×6
STRIP CLOSURE SKIN 1/2X4 (GAUZE/BANDAGES/DRESSINGS) ×2 IMPLANT
SUT MNCRL AB 4-0 PS2 18 (SUTURE) ×3 IMPLANT
SUT SURGIDAC NAB ES-9 0 48 120 (SUTURE) IMPLANT
SUT VICRYL 0 TIES 12 18 (SUTURE) ×3 IMPLANT
SYR 20ML LL LF (SYRINGE) ×3 IMPLANT
SYR 50ML LL SCALE MARK (SYRINGE) ×3 IMPLANT
TOWEL OR 17X26 10 PK STRL BLUE (TOWEL DISPOSABLE) ×3 IMPLANT
TOWEL OR NON WOVEN STRL DISP B (DISPOSABLE) ×3 IMPLANT
TROCAR BLADELESS 15MM (ENDOMECHANICALS) ×3 IMPLANT
TROCAR BLADELESS OPT 5 100 (ENDOMECHANICALS) ×3 IMPLANT
TUBING CONNECTING 10 (TUBING) ×4 IMPLANT
TUBING CONNECTING 10' (TUBING) ×2
TUBING ENDO SMARTCAP (MISCELLANEOUS) ×3 IMPLANT

## 2018-08-18 NOTE — Progress Notes (Signed)
Discussed post op day goals with patient including ambulation, IS, diet progression, pain, and nausea control.  BSTOP education provided including BSTOP information guide, "Guide for Pain Management after your Bariatric Procedure".  Questions answered.

## 2018-08-18 NOTE — Discharge Instructions (Signed)
GASTRIC BYPASS/SLEEVE  Home Care Instructions   These instructions are to help you care for yourself when you go home.  Call: If you have any problems.  Call [phone] and ask for the surgeon on call  If you need immediate help, come to the ER at [facility].   Tell the ER staff that you are a new post-op gastric bypass or gastric sleeve patient   Signs and symptoms to report:  Severe vomiting or nausea o If you cannot keep down clear liquids for longer than 1 day, call your surgeon   Abdominal pain that does not get better after taking your pain medication  Fever over 100.4 F with chills  Heart beating over 100 beats a minute  Shortness of breath at rest  Chest pain   Redness, swelling, drainage, or foul odor at incision (surgical) sites   If your incisions open or pull apart  Swelling or pain in calf (lower leg)  Diarrhea (Loose bowel movements that happen often), frequent watery, uncontrolled bowel movements  Constipation, (no bowel movements for 3 days) if this happens: Pick one o Milk of Magnesia, 2 tablespoons by mouth, 3 times a day for 2 days if needed o Stop taking Milk of Magnesia once you have a bowel movement o Call your doctor if constipation continues Or o Miralax  (instead of Milk of Magnesia) following the label instructions o Stop taking Miralax once you have a bowel movement o Call your doctor if constipation continues  Anything you think is not normal   Normal side effects after surgery:  Unable to sleep at night or unable to focus  Irritability or moody  Being tearful (crying) or depressed These are common complaints, possibly related to your anesthesia medications that put you to sleep, stress of surgery, and change in lifestyle.  This usually goes away a few weeks after surgery.  If these feelings continue, call your primary care doctor.   Wound Care: You may have surgical glue, steri-strips, or staples over your incisions after  surgery  Surgical glue:  Looks like a clear film over your incisions and will wear off a little at a time  Steri-strips: Strips of tape over your incisions. You may notice a yellowish color on the skin under the steri-strips. This is used to make the   steri-strips stick better. Do not pull the steri-strips off - let them fall off  Staples: Staples may be removed before you leave the hospital o If you go home with staples, call [facility], [phone]) [phone] at for an appointment with your surgeons nurse to have staples removed 10 days after surgery.  Showering: You may shower two (2) days after your surgery unless your surgeon tells you differently o Wash gently around incisions with warm soapy water, rinse well, and gently pat dry  o No tub baths until staples are removed, steri-strips fall off or glue is gone.    Medications:  Medications should be liquid or crushed if larger than the size of a dime  Extended release pills (medication that release a little bit at a time through the day) should NOT be crushed or cut. (examples include XL, ER, DR, SR)  Depending on the size and number of medications you take, you may need to space (take a few throughout the day)/change the time you take your medications so that you do not over-fill your pouch (smaller stomach)  Make sure you follow-up with your primary care doctor to  make medication changes needed during rapid weight loss and life-style changes  If you have diabetes, follow up with the doctor that orders your diabetes medication(s) within one week after surgery and check your blood sugar regularly.  Do not drive while taking prescription pain medication   It is ok to take Tylenol by the bottle instructions with your pain medicine or instead of your pain medicine as needed.  DO NOT TAKE NSAIDS (EXAMPLES OF NSAIDS:  IBUPROFREN/ NAPROXEN)  Diet:                    First 2 Weeks  You will see the dietician t about two (2) weeks  after your surgery. The dietician will increase the types of foods you can eat if you are handling liquids well:  If you have severe vomiting or nausea and cannot keep down clear liquids lasting longer than 1 day, call your surgeon @ [phone]) Protein Shake  Drink at least 2 ounces of shake 5-6 times per day  Each serving of protein shakes (usually 8 - 12 ounces) should have: o 15 grams of protein  o And no more than 5 grams of carbohydrate   Goal for protein each day: o Men = 80 grams per day o Women = 60 grams per day  Protein powder may be added to fluids such as non-fat milk or Lactaid milk or unsweetened Soy/Almond milk (limit to 35 grams added protein powder per serving)  Hydration  Slowly increase the amount of water and other clear liquids as tolerated (See Acceptable Fluids)  Slowly increase the amount of protein shake as tolerated    Sip fluids slowly and throughout the day.  Do not use straws.  May use sugar substitutes in small amounts (no more than 6 - 8 packets per day; i.e. Splenda)  Fluid Goal  The first goal is to drink at least 8 ounces of protein shake/drink per day (or as directed by the nutritionist); some examples of protein shakes are [employer], [employer], EAS Edge HP, and Unjury. See handout from pre-op Bariatric Education Class: o Slowly increase the amount of protein shake you drink as tolerated o You may find it easier to slowly sip shakes throughout the day o It is important to get your proteins in first  Your fluid goal is to drink 64 - 100 ounces of fluid daily o It may take a few weeks to build up to this  32 oz (or more) should be clear liquids  And   32 oz (or more) should be full liquids (see below for examples)  Liquids should not contain sugar, caffeine, or carbonation  Clear Liquids:  Water or Sugar-free flavored water (i.e. Fruit H2O, Propel)  Decaffeinated coffee or tea (sugar-free)  Crystal Lite, Wylers Lite,  Minute Maid Lite  Sugar-free Jell-O  Bouillon or broth  Sugar-free Popsicle:   *Less than 20 calories each; Limit 1 per day  Full Liquids: Protein Shakes/Drinks + 2 choices per day of other full liquids  Full liquids must be: o No More Than 15 grams of Carbs per serving  o No More Than 3 grams of Fat per serving  Strained low-fat cream soup (except Cream of Potato or Tomato)  Non-Fat milk  Fat-free Lactaid Milk  Unsweetened Soy Or Unsweetened Almond Milk  Low Sugar yogurt (Dannon Lite & Fit, Greek yogurt; Oikos Triple Zero; Chobani Simply 100; Yoplait 100 calorie [place] - No Fruit on the Bottom)    Vitamins  and Minerals  Start 1 day after surgery unless otherwise directed by your surgeon  2 Chewable Bariatric Specific Multivitamin / Multimineral Supplement with iron (Example: Bariatric Advantage Multi EA)  Chewable Calcium with Vitamin D-3 (Example: 3 Chewable Calcium Plus 600 with Vitamin D-3) o Take 500 mg three (3) times a day for a total of 1500 mg each day o Do not take all 3 doses of calcium at one time as it may cause constipation, and you can only absorb 500 mg  at a time  o Do not mix multivitamins containing iron with calcium supplements; take 2 hours apart  Menstruating women and those with a history of anemia (a blood disease that causes weakness) may need extra iron o Talk with your doctor to see if you need more iron  Do not stop taking or change any vitamins or minerals until you talk to your dietitian or surgeon  Your Dietitian and/or surgeon must approve all vitamin and mineral supplements   Activity and Exercise: Limit your physical activity as instructed by your doctor.  It is important to continue walking at home.  During this time, use these guidelines:  Do not lift anything greater than ten (10) pounds for at least two (2) weeks  Do not go back to work or drive until Engineer, production says you can  You may have sex when you feel comfortable  o It is  VERY important for female patients to use a reliable birth control method; fertility often increases after surgery  o All hormonal birth control will be ineffective for 30 days after surgery due to medications given during surgery a barrier method must be used. o Do not get pregnant for at least 18 months  Start exercising as soon as your doctor tells you that you can o Make sure your doctor approves any physical activity  Start with a simple walking program  Walk 5-15 minutes each day, 7 days per week.   Slowly increase until you are walking 30-45 minutes per day Consider joining our [place] program. [phone] or email [email or web address] [email or web address]   Special Instructions Things to remember:  Use your CPAP when sleeping if this applies to you   [facility] has two free Bariatric Surgery Support Groups that meet monthly o The 3rd Thursday of each month, 6 pm, [facility]  o The 2nd Friday of each month, 11:45 am in the private dining room in the basement of [place]  It is very important to keep all follow up appointments with your surgeon, dietitian, primary care physician, and behavioral health practitioner  Routine follow up schedule with your surgeon include appointments at 2-3 weeks, 6-8 weeks, 6 months, and 1 year at a minimum.  Your surgeon may request to see you more often.   o After the first year, please follow up with your bariatric surgeon and dietitian at least once a year in order to maintain best weight loss results [facility]: [place]: [phone] Bariatric Nurse Coordinator: [phone]      Reviewed and Endorsed  by [facility] Patient Education Committee, June, 2016 Edits Approved: Aug, 2018

## 2018-08-18 NOTE — Progress Notes (Signed)
CBG's discontinued after patient refuses CBG's every 4. Current CBG 152 post surgery.  Discussed with patient the rationale for CBG test, but patient states her A1C is good and she does not want this done.  Sent Dr Redmond Pulling message regarding patient concern.  Order to discontinue. Discussed with bedside RN.

## 2018-08-18 NOTE — Op Note (Signed)
Preoperative diagnosis: laparoscopic sleeve gastrectomy  Postoperative diagnosis: Same   Procedure: Upper endoscopy   Surgeon: Clovis Riley, M.D.  Anesthesia: Gen.   Description of procedure: The endoscopy was placed in the mouth and into the oropharynx and under endoscopic vision it was advanced to the esophagogastric junction which was identified at 38cm from the teeth.  The pouch was tensely insufflated while the abdomen was flooded with saline to perform a leak test, which was negative- no bubbles were seen.  The staple line is hemostatic. The lumen is evenly tubular without any undue narrowing, angulation or twisting specifically at the incisura angularis. The stomach is deompressed and the scope was withdrawn without difficulty.    Clovis Riley, M.D. General, Bariatric, & Minimally Invasive Surgery [facility]

## 2018-08-18 NOTE — Progress Notes (Signed)
PHARMACY CONSULT FOR:  Risk Assessment for Post-Discharge VTE Following Bariatric Surgery  Post-Discharge VTE Risk Assessment: This patient's probability of 30-day post-discharge VTE is increased due to the factors marked:   Female    Age >/=60 years  X  BMI >/=50 kg/m2    CHF    Dyspnea at Rest    Paraplegia   X Non-gastric-band surgery    Operation Time >/=3 hr    Return to OR     Length of Stay >/= 3 d   Predicted probability of 30-day post-discharge VTE: 0.51%   Recommendation for Discharge: . No pharmacologic prophylaxis post-discharge . Enoxaparin 40 mg [place] q12h x 2 weeks post-discharge . Enoxaparin 40 mg [place] q12h x 4 weeks post-discharge . Enoxaparin 60 mg [place] q12h x 2 weeks post-discharge . Enoxaparin 60 mg  q12h x 4 weeks post-discharge . Case manager consulted to review insurance coverage and provide estimated patient cost . Follow daily and recalculate estimated 30d VTE risk if returns to OR or LOS reaches 3 days.   Valerie Lewis is a 43 y.o. female who underwent sleeve gastrectomy  on 08/18/18.   Case start: 0945 Case end: 1044   Allergies  Allergen Reactions  . Amoxicillin Nausea And Vomiting    Did it involve swelling of the face/tongue/throat, SOB, or low BP? No Did it involve sudden or severe rash/hives, skin peeling, or any reaction on the inside of your mouth or nose? No Did you need to seek medical attention at a hospital or doctor's office? No When did it last happen?5 years ago If all above answers are "NO", may proceed with cephalosporin use.     Patient Measurements: Height: 5' 1"  (154.9 cm) Weight: 270 lb 4 oz (122.6 kg) IBW/kg (Calculated) : 47.8 Body mass index is 51.06 kg/m.  Recent Labs    08/18/18 1148  HGB 12.8  HCT 41.0   Estimated Creatinine Clearance: 110.1 mL/min (by C-G formula based on SCr of 0.79 mg/dL).  Past Medical History:  Diagnosis Date  . Obesity   . Pre-diabetes     Medications Prior to Admission   Medication Sig Dispense Refill Last Dose  . ergocalciferol (VITAMIN D2) 1.25 MG (50000 UT) capsule Take 50,000 Units by mouth once a week.     Marland Kitchen lisinopril (ZESTRIL) 20 MG tablet Take 20 mg by mouth daily.     . vitamin B-12 (CYANOCOBALAMIN) 1000 MCG tablet Take 1,000 mcg by mouth daily.       Reuel Boom, PharmD, BCPS [phone] 08/18/2018, 2:59 PM

## 2018-08-18 NOTE — Anesthesia Procedure Notes (Signed)
Procedure Name: Intubation Date/Time: 08/18/2018 9:30 AM Performed by: Niel Hummer, CRNA Pre-anesthesia Checklist: Patient identified, Emergency Drugs available, Suction available and Patient being monitored Patient Re-evaluated:Patient Re-evaluated prior to induction Oxygen Delivery Method: Circle system utilized Preoxygenation: Pre-oxygenation with 100% oxygen Induction Type: IV induction and Rapid sequence Ventilation: Mask ventilation without difficulty Laryngoscope Size: Glidescope and 4 Grade View: Grade I Tube type: Oral Tube size: 7.0 mm Number of attempts: 1 Airway Equipment and Method: Stylet and Video-laryngoscopy Placement Confirmation: ETT inserted through vocal cords under direct vision,  positive ETCO2 and breath sounds checked- equal and bilateral Secured at: 21 cm Tube secured with: Tape Dental Injury: Teeth and Oropharynx as per pre-operative assessment  Comments: Glidescope used electively due to limited mouth opening from prior dental procedure. No complications noted.

## 2018-08-18 NOTE — Interval H&P Note (Signed)
History and Physical Interval Note:  08/18/2018 9:04 AM  Valerie Lewis  has presented today for surgery, with the diagnosis of Morbid Obesity, Pre Diabetes, Vitamin D Def, Vit b12 Def,.  The various methods of treatment have been discussed with the patient and family. After consideration of risks, benefits and other options for treatment, the patient has consented to  Procedure(s): LAPAROSCOPIC GASTRIC SLEEVE RESECTION, Upper Endo, ERAS Pathway (N/A) as a surgical intervention.  The patient's history has been reviewed, patient examined, no change in status, stable for surgery.  I have reviewed the patient's chart and labs.  Questions were answered to the patient's satisfaction.    Leighton Ruff. Redmond Pulling, MD, FACS General, Bariatric, & Minimally Invasive Surgery [facility]   [name]

## 2018-08-18 NOTE — Op Note (Signed)
08/18/2018 Valerie Lewis 1975-09-07 [id number]   PRE-OPERATIVE DIAGNOSIS:     Severe obesity (BMI 51)   Essential hypertension   Prediabetes   Bilateral chronic knee pain   Hypertriglyceridemia  POST-OPERATIVE DIAGNOSIS:  same  PROCEDURE:  Procedure(s): LAPAROSCOPIC SLEEVE GASTRECTOMY  UPPER GI ENDOSCOPY  SURGEON:  Surgeon(s): Gayland Curry, MD FACS FASMBS  ASSISTANTS: Romana Juniper MD FACS  ANESTHESIA:   general  DRAINS: none   BOUGIE: 40 fr ViSiGi  LOCAL MEDICATIONS USED:   Exparel  EBL: minimal  SPECIMEN:  Source of Specimen:  Greater curvature of stomach  DISPOSITION OF SPECIMEN:  PATHOLOGY  COUNTS:  YES  INDICATION FOR PROCEDURE: This is a very pleasant 43 y.o.-year-old morbidly obese female who has had unsuccessful attempts for sustained weight loss. The patient presents today for a planned laparoscopic sleeve gastrectomy with upper endoscopy. We have discussed the risk and benefits of the procedure extensively preoperatively. Please see my separate notes.  PROCEDURE: After obtaining informed consent and receiving 5000 units of subcutaneous heparin, the patient was brought to the operating room at [facility] and placed supine on the operating room table. General endotracheal anesthesia was established. Sequential compression devices were placed. A orogastric tube was placed. The patient's abdomen was prepped and draped in the usual standard surgical fashion. The patient received preoperative IV antibiotic. A surgical timeout was performed. ERAS protocol used.   Access to the abdomen was achieved using a 5 mm 0 laparoscope thru a 5 mm trocar In the left upper Quadrant 2 fingerbreadths below the left subcostal margin using the Optiview technique. Pneumoperitoneum was smoothly established up to 15 mm of mercury. The laparoscope was advanced and the abdominal cavity was surveilled. The patient was then placed in reverse Trendelenburg.   A 5 mm trocar was  placed slightly above and to the left of the umbilicus under direct visualization.  The [facility] liver retractor was placed under the left lobe of the liver through a 5 mm trocar incision site in the subxiphoid position. A 5 mm trocar was placed in the lateral right upper quadrant along with a 15 mm trocar in the mid right abdomen. A final 5 mm trocar was placed in the lateral LUQ.  All under direct visualization after exparel had been infiltrated in bilateral lateral upper abdominal walls as a TAP block.  The stomach was inspected. It was completely decompressed and the orogastric tube was removed.  There was no small anterior dimple that was obviously visible. Her preop UGI showed no hiatal hernia.  We identified the pylorus and measured 6 cm proximal to the pylorus and identified an area of where we would start taking down the short gastric vessels. Harmonic scalpel was used to take down the short gastric vessels along the greater curvature of the stomach. We were able to enter the lesser sac. We continued to march along the greater curvature of the stomach taking down the short gastrics. As we approached the gastrosplenic ligament we took care in this area not to injure the spleen. We were able to take down the entire gastrosplenic ligament. We then mobilized the fundus away from the left crus of diaphragm. There were not any significant posterior gastric avascular attachments. This left the stomach completely mobilized. No vessels had been taken down along the lesser curvature of the stomach.  We then reidentified the pylorus. A 40Fr ViSiGi was then placed in the oropharynx and advanced down into the stomach and placed in the distal antrum and  positioned along the lesser curvature. It was placed under suction which secured the 40Fr ViSiGi in place along the lesser curve. Then using the [employer] echelon 60 mm stapler with a green load with Seamguard, I placed a stapler along the antrum approximately 5 cm  from the pylorus. The stapler was angled so that there is ample room at the angularis incisura. I then fired the first staple load after inspecting it posteriorly to ensure adequate space both anteriorly and posteriorly. At this point I still was not completely past the angularis so with a gold load with Seamguard, I placed the stapler in position just inside the prior stapleline. We then rotated the stomach to insure that there was adequate anteriorly as well as posteriorly. The stapler was then fired.  At this point I started using 60 mm blue load staple cartridges with Seamguard. The echelon stapler was then repositioned with a 60 mm blue load with Seamguard and we continued to march up along the [place]. My assistant was holding traction along the greater curvature stomach along the cauterized short gastric vessels ensuring that the stomach was symmetrically retracted. Prior to each firing of the staple, we rotated the stomach to ensure that there is adequate stomach left.  As we approached the fundus, I used 60 mm blue cartridge with Seamguard aiming  lateral to the GE junction after mobilizing some of the esophageal fat pad.  The sleeve was inspected. There is no evidence of cork screw. The staple line appeared hemostatic. The CRNA inflated the ViSiGi to the green zone and the upper abdomen was flooded with saline. There were no bubbles. The sleeve was decompressed and the ViSiGi removed. My assistant scrubbed out and performed an upper endoscopy. The sleeve easily distended with air and the scope was easily advanced to the pylorus. There is no evidence of internal bleeding or cork screwing. There was no narrowing at the angularis. There is no evidence of bubbles. Please see his operative note for further details. The gastric sleeve was decompressed and the endoscope was removed.  The greater curvature the stomach was grasped with a laparoscopic grasper and removed from the 15 mm trocar site.  The liver  retractor was removed. I then closed the 15 mm trocar site with 1 interrupted 0 Vicryl sutures through the fascia using the endoclose. The closure was viewed laparoscopically and it was airtight. Remaining Exparel was then infiltrated in the preperitoneal spaces around the trocar sites. Pneumoperitoneum was released. All trocar sites were closed with a 4-0 Monocryl in a subcuticular fashion followed by the application of benzoin, steri-strips, and bandaids. The patient was extubated and taken to the recovery room in stable condition. All needle, instrument, and sponge counts were correct x2. There are no immediate complications  (1) 60 mm green with Seamguard (1) 60 mm gold with seamguard (3) 60 mm blue with  seamguard  PLAN OF CARE: Admit to inpatient   PATIENT DISPOSITION:  PACU - hemodynamically stable.   Delay start of Pharmacological VTE agent (>24hrs) due to surgical blood loss or risk of bleeding:  no  Leighton Ruff. Redmond Pulling, MD, FACS FASMBS General, Bariatric, & Minimally Invasive Surgery [facility], [place]

## 2018-08-18 NOTE — Transfer of Care (Signed)
Immediate Anesthesia Transfer of Care Note  Patient: Valerie Lewis  Procedure(s) Performed: LAPAROSCOPIC GASTRIC SLEEVE RESECTION, Upper Endo, ERAS Pathway (N/A Abdomen)  Patient Location: PACU  Anesthesia Type:General  Level of Consciousness: awake, alert  and oriented  Airway & Oxygen Therapy: Patient Spontanous Breathing and Patient connected to face mask oxygen  Post-op Assessment: Report given to RN and Post -op Vital signs reviewed and stable  Post vital signs: Reviewed and stable  Last Vitals:  Vitals Value Taken Time  BP 149/90 08/18/18 1051  Temp    Pulse 88 08/18/18 1053  Resp 27 08/18/18 1053  SpO2 98 % 08/18/18 1053  Vitals shown include unvalidated device data.  Last Pain:  Vitals:   08/18/18 0805  TempSrc: Oral      Patients Stated Pain Goal: 4 (99/35/70 1779)  Complications: No apparent anesthesia complications

## 2018-08-18 NOTE — Anesthesia Postprocedure Evaluation (Signed)
Anesthesia Post Note  Patient: Valerie Lewis  Procedure(s) Performed: LAPAROSCOPIC GASTRIC SLEEVE RESECTION, Upper Endo, ERAS Pathway (N/A Abdomen)     Patient location during evaluation: PACU Anesthesia Type: General Level of consciousness: awake and alert Pain management: pain level controlled Vital Signs Assessment: post-procedure vital signs reviewed and stable Respiratory status: spontaneous breathing, nonlabored ventilation, respiratory function stable and patient connected to nasal cannula oxygen Cardiovascular status: blood pressure returned to baseline and stable Postop Assessment: no apparent nausea or vomiting Anesthetic complications: no    Last Vitals:  Vitals:   08/18/18 1415 08/18/18 1435  BP: (!) 159/94 (!) 156/101  Pulse: 95 97  Resp: 15   Temp: 36.8 C 36.8 C  SpO2: 95% 97%    Last Pain:  Vitals:   08/18/18 1435  TempSrc: Oral  PainSc:                  Catalina Gravel

## 2018-08-19 ENCOUNTER — Inpatient Hospital Stay (HOSPITAL_COMMUNITY): Payer: BC Managed Care – PPO

## 2018-08-19 ENCOUNTER — Encounter (HOSPITAL_COMMUNITY): Payer: Self-pay | Admitting: General Surgery

## 2018-08-19 LAB — COMPREHENSIVE METABOLIC PANEL
ALT: 41 U/L (ref 0–44)
AST: 26 U/L (ref 15–41)
Albumin: 3.5 g/dL (ref 3.5–5.0)
Alkaline Phosphatase: 63 U/L (ref 38–126)
Anion gap: 9 (ref 5–15)
BUN: 9 mg/dL (ref 6–20)
CO2: 25 mmol/L (ref 22–32)
Calcium: 8.3 mg/dL — ABNORMAL LOW (ref 8.9–10.3)
Chloride: 104 mmol/L (ref 98–111)
Creatinine, Ser: 0.67 mg/dL (ref 0.44–1.00)
GFR calc Af Amer: >60 mL/min (ref >60)
GFR calc non Af Amer: >60 mL/min (ref >60)
Glucose, Bld: 147 mg/dL — ABNORMAL HIGH (ref 70–99)
Potassium: 4.3 mmol/L (ref 3.5–5.1)
Sodium: 138 mmol/L (ref 135–145)
Total Bilirubin: 0.4 mg/dL (ref 0.3–1.2)
Total Protein: 7 g/dL (ref 6.5–8.1)

## 2018-08-19 LAB — CBC WITH DIFFERENTIAL/PLATELET
Abs Immature Granulocytes: 0.22 10*3/uL — ABNORMAL HIGH (ref 0.00–0.07)
Basophils Absolute: 0 10*3/uL (ref 0.0–0.1)
Basophils Relative: 0 %
Eosinophils Absolute: 0 10*3/uL (ref 0.0–0.5)
Eosinophils Relative: 0 %
HCT: 40.8 % (ref 36.0–46.0)
Hemoglobin: 12.7 g/dL (ref 12.0–15.0)
Immature Granulocytes: 2 %
Lymphocytes Relative: 12 %
Lymphs Abs: 1.3 10*3/uL (ref 0.7–4.0)
MCH: 28.3 pg (ref 26.0–34.0)
MCHC: 31.1 g/dL (ref 30.0–36.0)
MCV: 90.9 fL (ref 80.0–100.0)
Monocytes Absolute: 0.5 10*3/uL (ref 0.1–1.0)
Monocytes Relative: 5 %
Neutro Abs: 8.9 10*3/uL — ABNORMAL HIGH (ref 1.7–7.7)
Neutrophils Relative %: 81 %
Platelets: 328 10*3/uL (ref 150–400)
RBC: 4.49 MIL/uL (ref 3.87–5.11)
RDW: 14.6 % (ref 11.5–15.5)
WBC: 10.9 10*3/uL — ABNORMAL HIGH (ref 4.0–10.5)
nRBC: 0 % (ref 0.0–0.2)

## 2018-08-19 MED ORDER — ENOXAPARIN (LOVENOX) PATIENT EDUCATION KIT
PACK | Freq: Once | Status: AC
  Administered 2018-08-19: 12:00:00
  Filled 2018-08-19: qty 1

## 2018-08-19 MED ORDER — LISINOPRIL 20 MG PO TABS
20.0000 mg | ORAL_TABLET | Freq: Every day | ORAL | Status: DC
  Administered 2018-08-19 – 2018-08-20 (×2): 20 mg via ORAL
  Filled 2018-08-19 (×2): qty 1

## 2018-08-19 NOTE — Progress Notes (Signed)
Patient alert and oriented, Post op day 1.  Provided support and encouragement.  Encouraged pulmonary toilet (flutter valve at bedside), ambulation and small sips of liquids.  All questions answered.  Will continue to monitor.

## 2018-08-19 NOTE — Progress Notes (Addendum)
Patient ID: Valerie Lewis, female   DOB: 09/09/1975, 43 y.o.   MRN: [id number]   Progress Note: Metabolic and Bariatric Surgery Service   Chief Complaint/Subjective: Water went well. Having epigastric pain with shake so far - states not drinking fast, drinking upright Also feels some pain in Rt mid back like when I have PNA and also has a cough - wondering if she has PNA States she is doing IS HTN issues  Objective: Vital signs in last 24 hours: Temp:  [97.4 F (36.3 C)-98.3 F (36.8 C)] 97.6 F (36.4 C) (08/04 0523) Pulse Rate:  [86-103] 92 (08/04 0523) Resp:  [13-23] 18 (08/04 0523) BP: (111-176)/(82-106) 163/94 (08/04 0523) SpO2:  [93 %-99 %] 96 % (08/04 0523) Weight:  [122.6 kg] 122.6 kg (08/03 0829) Last BM Date: 08/17/18  Intake/Output from previous day: 08/03 0701 - 08/04 0700 In: 3011.8 [P.O.:540; I.V.:2355.7; IV Piggyback:116.1] Out: 2675 [Urine:2650; Blood:25] Intake/Output this shift: No intake/output data recorded.  Lungs: cta, some upper airway congestion  Cardiovascular: reg  Abd: obese, soft, approp TTP, dressing ok  Extremities: no edema, +SCDs  Neuro: alert, ox3  Lab Results: CBC  Recent Labs    08/18/18 1148 08/19/18 0321  WBC  --  10.9*  HGB 12.8 12.7  HCT 41.0 40.8  PLT  --  328   BMET Recent Labs    08/19/18 0321  NA 138  K 4.3  CL 104  CO2 25  GLUCOSE 147*  BUN 9  CREATININE 0.67  CALCIUM 8.3*   PT/INR No results for input(s): LABPROT, INR in the last 72 hours. ABG No results for input(s): PHART, HCO3 in the last 72 hours.  Invalid input(s): PCO2, PO2  Studies/Results:  Anti-infectives: Anti-infectives (From admission, onward)   Start     Dose/Rate Route Frequency Ordered Stop   08/18/18 0600  gentamicin (GARAMYCIN) 390 mg, clindamycin (CLEOCIN) 900 mg in dextrose 5 % 100 mL IVPB     5 mg/kg  78.2 kg (Adjusted) 231.5 mL/hr over 30 Minutes Intravenous On call to O.R. 08/17/18 1036 08/18/18 0950       Medications: Scheduled Meds: . acetaminophen  1,000 mg Oral Q8H   Or  . acetaminophen (TYLENOL) oral liquid 160 mg/5 mL  1,000 mg Oral Q8H  . enoxaparin (LOVENOX) injection  30 mg Subcutaneous Q12H  . gabapentin  200 mg Oral Q12H  . lisinopril  20 mg Oral Daily  . pantoprazole (PROTONIX) IV  40 mg Intravenous QHS  . Ensure Max Protein  2 oz Oral Q2H   Continuous Infusions: . 0.9 % NaCl with KCl 20 mEq / L 125 mL/hr at 08/19/18 0045   PRN Meds:.hydrALAZINE, ketorolac, morphine injection, ondansetron (ZOFRAN) IV, oxyCODONE, promethazine, simethicone  Assessment/Plan: Patient Active Problem List   Diagnosis Date Noted  . Severe obesity ([place]) 08/18/2018  . Essential hypertension 08/18/2018  . Prediabetes 08/18/2018  . Bilateral chronic knee pain 08/18/2018  . Hypertriglyceridemia 08/18/2018  . S/P laparoscopic sleeve gastrectomy 08/18/2018   s/p Procedure(s): LAPAROSCOPIC GASTRIC SLEEVE RESECTION, Upper Endo, ERAS Pathway 08/18/2018  pulm - suspicion for PNA, prob atelectasis, check cxr, add flutter valve, OOB, IS CV - restart lisinopril  vte prophylaxis - cont lovenox, scds; vte post discharge risk calculator elevated - start lovenox teaching.  GI - cont water, try chicken soup shakes  Disposition: unclear about dc today - depends on protein intake  LOS: 1 day    [name], MD [phone] [facility]

## 2018-08-19 NOTE — Progress Notes (Signed)
Patient has nonproductive cough and some congestion,  patient mentioned that she has a history of pneumonia and requesting a chest XRAY. Patient mentioned that she felt similar to how she feels now when she last had pneumonia. MD on call notified that patient is requesting chest XRAY. MD on call, Dr. Lucia Gaskins did not feel the need to order an XRAY due to patient recent surgery and timeframe and patient prior intubation during surgery. Reinforcement of use of incentive spirometer and cough and deep breathing. Patient was made aware of MD's decision to not order XRAY. Patient will be seen in the AM by provider. Dawson Bills, RN

## 2018-08-19 NOTE — TOC Benefit Eligibility Note (Signed)
Transition of Care [facility]) Benefit Eligibility Note    Patient Details  Name: Valerie Lewis MRN: [id number] Date of Birth: 06/16/1975   Medication/Dose: Enoxaparin 41m [place] q12hrs  Covered?: Yes  Tier: 3 Drug  Prescription Coverage Preferred Pharmacy: local  Spoke with Person/Company/Phone Number:: [phone] Co-Pay: $111.11  Prior Approval: No  Deductible: Met       FKerin SalenPhone Number: 08/19/2018, 1:21 PM

## 2018-08-20 ENCOUNTER — Inpatient Hospital Stay (HOSPITAL_COMMUNITY): Payer: BC Managed Care – PPO

## 2018-08-20 DIAGNOSIS — R609 Edema, unspecified: Secondary | ICD-10-CM

## 2018-08-20 LAB — CBC WITH DIFFERENTIAL/PLATELET
Abs Immature Granulocytes: 0.19 10*3/uL — ABNORMAL HIGH (ref 0.00–0.07)
Basophils Absolute: 0.1 10*3/uL (ref 0.0–0.1)
Basophils Relative: 1 %
Eosinophils Absolute: 0 10*3/uL (ref 0.0–0.5)
Eosinophils Relative: 0 %
HCT: 41 % (ref 36.0–46.0)
Hemoglobin: 12.7 g/dL (ref 12.0–15.0)
Immature Granulocytes: 2 %
Lymphocytes Relative: 32 %
Lymphs Abs: 3.3 10*3/uL (ref 0.7–4.0)
MCH: 28.3 pg (ref 26.0–34.0)
MCHC: 31 g/dL (ref 30.0–36.0)
MCV: 91.5 fL (ref 80.0–100.0)
Monocytes Absolute: 0.7 10*3/uL (ref 0.1–1.0)
Monocytes Relative: 7 %
Neutro Abs: 6 10*3/uL (ref 1.7–7.7)
Neutrophils Relative %: 58 %
Platelets: 325 10*3/uL (ref 150–400)
RBC: 4.48 MIL/uL (ref 3.87–5.11)
RDW: 14.6 % (ref 11.5–15.5)
WBC: 10.3 10*3/uL (ref 4.0–10.5)
nRBC: 0 % (ref 0.0–0.2)

## 2018-08-20 MED ORDER — TRAMADOL HCL 50 MG PO TABS: 50.0000 mg | ORAL_TABLET | Freq: Four times a day (QID) | ORAL | 0 refills | Status: AC | PRN

## 2018-08-20 MED ORDER — GABAPENTIN 100 MG PO CAPS: 200.0000 mg | ORAL_CAPSULE | Freq: Two times a day (BID) | ORAL | 0 refills | Status: DC

## 2018-08-20 MED ORDER — ENOXAPARIN SODIUM 40 MG/0.4ML ~~LOC~~ SOLN: 40.0000 mg | Freq: Two times a day (BID) | SUBCUTANEOUS | 0 refills | Status: DC

## 2018-08-20 MED ORDER — PANTOPRAZOLE SODIUM 40 MG PO TBEC: 40.0000 mg | DELAYED_RELEASE_TABLET | Freq: Every day | ORAL | 0 refills | Status: DC

## 2018-08-20 MED ORDER — ONDANSETRON 4 MG PO TBDP: 4.0000 mg | ORAL_TABLET | Freq: Four times a day (QID) | ORAL | 0 refills | Status: DC | PRN

## 2018-08-20 MED ORDER — ACETAMINOPHEN 500 MG PO TABS: 1000.0000 mg | ORAL_TABLET | Freq: Three times a day (TID) | ORAL | 0 refills | Status: AC

## 2018-08-20 NOTE — Progress Notes (Signed)
Patient alert and oriented, pain is controlled. Patient is tolerating fluids, advanced to protein shake today, patient is tolerating well.  Reviewed Gastric sleeve discharge instructions with patient and patient is able to articulate understanding.  Provided information on BELT program, Support Group and [facility] outpatient pharmacy. All questions answered, will continue to monitor.  Total fluid intake 897 Per dehydration protocol call back one week post op

## 2018-08-20 NOTE — Progress Notes (Signed)
Lower extremity venous has been completed.   Preliminary results in CV Proc.   Abram Sander 08/20/2018 8:39 AM

## 2018-08-20 NOTE — Progress Notes (Signed)
Patient alert and oriented, Post op day 2.  Provided support and encouragement.  Encouraged pulmonary toilet, ambulation and small sips of liquids.  All questions answered.  Will continue to monitor.

## 2018-08-20 NOTE — Progress Notes (Signed)
Lovenox education reviewed, patient able to articulate proper technique, s/s to report, and Lovenox schedule to [facility].  Lovenox teaching kit has been provided at bedside.  Preliminary Venous doppler results show no DVT, information sent to [place].

## 2018-08-20 NOTE — Progress Notes (Signed)
Called patient and notified her her Lovenox was sent to [facility] in [place]

## 2018-08-20 NOTE — Progress Notes (Signed)
Patient ID: Valerie Lewis, female   DOB: Dec 17, 1975, 43 y.o.   MRN: [id number]   Progress Note: Metabolic and Bariatric Surgery Service   Chief Complaint/Subjective: C/o some numbness in Rt ant medial calf more noticeable when walking/ feels like it is asleep Still some issues with nausea walking  Objective: Vital signs in last 24 hours: Temp:  [97.5 F (36.4 C)-98.7 F (37.1 C)] 98 F (36.7 C) (08/05 0618) Pulse Rate:  [68-92] 75 (08/05 0618) Resp:  [16-18] 18 (08/05 0618) BP: (129-171)/(66-111) 142/84 (08/05 0618) SpO2:  [96 %-98 %] 97 % (08/05 0618) Last BM Date: 08/17/18  Intake/Output from previous day: 08/04 0701 - 08/05 0700 In: 3552.2 [P.O.:777; I.V.:2775.2] Out: 2950 [Urine:2950] Intake/Output this shift: No intake/output data recorded.  Lungs: cta  Cardiovascular: reg  Abd: soft, obese, mild approp TTP  Extremities: no edema, calf not really tender; symmetric  Neuro: alert, ox3  Lab Results: CBC  Recent Labs    08/19/18 0321 08/20/18 0544  WBC 10.9* 10.3  HGB 12.7 12.7  HCT 40.8 41.0  PLT 328 325   BMET Recent Labs    08/19/18 0321  NA 138  K 4.3  CL 104  CO2 25  GLUCOSE 147*  BUN 9  CREATININE 0.67  CALCIUM 8.3*   PT/INR No results for input(s): LABPROT, INR in the last 72 hours. ABG No results for input(s): PHART, HCO3 in the last 72 hours.  Invalid input(s): PCO2, PO2  Studies/Results:  Anti-infectives: Anti-infectives (From admission, onward)   Start     Dose/Rate Route Frequency Ordered Stop   08/18/18 0600  gentamicin (GARAMYCIN) 390 mg, clindamycin (CLEOCIN) 900 mg in dextrose 5 % 100 mL IVPB     5 mg/kg  78.2 kg (Adjusted) 231.5 mL/hr over 30 Minutes Intravenous On call to O.R. 08/17/18 1036 08/18/18 0950      Medications: Scheduled Meds: . acetaminophen  1,000 mg Oral Q8H   Or  . acetaminophen (TYLENOL) oral liquid 160 mg/5 mL  1,000 mg Oral Q8H  . enoxaparin (LOVENOX) injection  30 mg Subcutaneous Q12H  .  gabapentin  200 mg Oral Q12H  . lisinopril  20 mg Oral Daily  . pantoprazole (PROTONIX) IV  40 mg Intravenous QHS  . Ensure Max Protein  2 oz Oral Q2H   Continuous Infusions: . 0.9 % NaCl with KCl 20 mEq / L 125 mL/hr at 08/20/18 0105   PRN Meds:.hydrALAZINE, ketorolac, morphine injection, ondansetron (ZOFRAN) IV, oxyCODONE, promethazine, simethicone  Assessment/Plan: Patient Active Problem List   Diagnosis Date Noted  . Severe obesity ([place]) 08/18/2018  . Essential hypertension 08/18/2018  . Prediabetes 08/18/2018  . Bilateral chronic knee pain 08/18/2018  . Hypertriglyceridemia 08/18/2018  . S/P laparoscopic sleeve gastrectomy 08/18/2018   s/p Procedure(s): LAPAROSCOPIC GASTRIC SLEEVE RESECTION, Upper Endo, ERAS Pathway 08/18/2018  Principal Problem:   Severe obesity ([place]) Active Problems:   Essential hypertension   Prediabetes   Bilateral chronic knee pain   Hypertriglyceridemia   S/P laparoscopic sleeve gastrectomy  HTN - come home med VTE prophy- cont subcu lovenox; lovenox teaching given elevated risk for post dc VTE, will go out on lovenox Calf numbness - probably positional from OR/PACU; will check u/s RLE to make sure no DVT Pulm - pulm toilet prob dc later today pending results above Disposition:  LOS: 2 days    [name], MD [phone] [facility]

## 2018-08-20 NOTE — Discharge Summary (Signed)
Physician Discharge Summary  Valerie Lewis ZHG:[id number] DOB: 1975/07/09 DOA: 08/18/2018  PCP: [facility]  Admit date: 08/18/2018 Discharge date: 08/20/2018  Recommendations for Outpatient Follow-up:   Follow-up Information    [name], MD. Go on 09/11/2018.   Specialty: General Surgery Why: Please arrive 15 minutes early for your 2 pm appointment.  Thank you Contact information: [address] [place] [address] [phone]        Carlena Hurl, PA-C. Go on 10/17/2018.   Specialty: General Surgery Why: Please arrive 15 minutes early for 915 appointment. Contact information: [place] [address] [phone]          Discharge Diagnoses:  Principal Problem:   Severe obesity ([place]) Active Problems:   Essential hypertension   Prediabetes   Bilateral chronic knee pain   Hypertriglyceridemia   S/P laparoscopic sleeve gastrectomy   Surgical Procedure: Laparoscopic Sleeve Gastrectomy, upper endoscopy  Discharge Condition: Good Disposition: Home  Diet recommendation: Postoperative sleeve gastrectomy diet (liquids only)  Filed Weights   08/18/18 0805 08/18/18 0829  Weight: 122.6 kg 122.6 kg     Hospital Course:  The patient was admitted for a planned laparoscopic sleeve gastrectomy. Please see operative note. Preoperatively the patient was given 5000 units of subcutaneous heparin for DVT prophylaxis. Postoperative prophylactic Lovenox dosing was started on the evening of postoperative day 0. ERAS protocol was used. On the evening of postoperative day 0, the patient was started on water and ice chips. On postoperative day 1 the patient had no fever or tachycardia and was tolerating water in their diet was gradually advanced throughout the day. She was restarted on home oral BP medication.  The patient was ambulating without difficulty. Their vital signs are stable without fever or tachycardia. Their hemoglobin  had remained stable. However her oral intake was not adequate for discharge on POD1. On POD 2 her oral intake improved. She complained of some right anterior medial calf numbness but a u/s of RLE was negative.  The patient had received discharge instructions and counseling. They were deemed stable for discharge and had met discharge criteria  She was at higher risk for post discharge VTE so pharmacy recommended 2 weeks of lovenox postop. She received teaching.    Discharge Instructions  Discharge Instructions    Ambulate hourly while awake   Complete by: As directed    Call MD for:  difficulty breathing, headache or visual disturbances   Complete by: As directed    Call MD for:  persistant dizziness or light-headedness   Complete by: As directed    Call MD for:  persistant nausea and vomiting   Complete by: As directed    Call MD for:  redness, tenderness, or signs of infection (pain, swelling, redness, odor or green/yellow discharge around incision site)   Complete by: As directed    Call MD for:  severe uncontrolled pain   Complete by: As directed    Call MD for:  temperature >101 F   Complete by: As directed    Diet bariatric full liquid   Complete by: As directed    Discharge instructions   Complete by: As directed    See bariatric discharge instructions   Incentive spirometry   Complete by: As directed    Perform hourly while awake     Allergies as of 08/20/2018      Reactions   Amoxicillin Nausea And Vomiting   Did it involve swelling  of the face/tongue/throat, SOB, or low BP? No Did it involve sudden or severe rash/hives, skin peeling, or any reaction on the inside of your mouth or nose? No Did you need to seek medical attention at a hospital or doctor's office? No When did it last happen?5 years ago If all above answers are "NO", may proceed with cephalosporin use.      Medication List    TAKE these medications   acetaminophen 500 MG tablet Commonly known as:  TYLENOL Take 2 tablets (1,000 mg total) by mouth every 8 (eight) hours for 5 days.   enoxaparin 40 MG/0.4ML injection Commonly known as: LOVENOX Inject 0.4 mLs (40 mg total) into the skin every 12 (twelve) hours for 14 days.   ergocalciferol 1.25 MG (50000 UT) capsule Commonly known as: VITAMIN D2 Take 50,000 Units by mouth once a week.   gabapentin 100 MG capsule Commonly known as: NEURONTIN Take 2 capsules (200 mg total) by mouth every 12 (twelve) hours.   lisinopril 20 MG tablet Commonly known as: ZESTRIL Take 20 mg by mouth daily. Notes to patient: Monitor Blood Pressure Daily and keep a log for primary care physician.  You may need to make changes to your medications with rapid weight loss.     ondansetron 4 MG disintegrating tablet Commonly known as: ZOFRAN-ODT Take 1 tablet (4 mg total) by mouth every 6 (six) hours as needed for nausea or vomiting.   pantoprazole 40 MG tablet Commonly known as: PROTONIX Take 1 tablet (40 mg total) by mouth daily.   traMADol 50 MG tablet Commonly known as: ULTRAM Take 1 tablet (50 mg total) by mouth every 6 (six) hours as needed for severe pain.   vitamin B-12 1000 MCG tablet Commonly known as: CYANOCOBALAMIN Take 1,000 mcg by mouth daily.      Follow-up Information    [name], MD. Go on 09/11/2018.   Specialty: General Surgery Why: Please arrive 15 minutes early for your 2 pm appointment.  Thank you Contact information: [address] [place] [address] [phone]        Carlena Hurl, PA-C. Go on 10/17/2018.   Specialty: General Surgery Why: Please arrive 15 minutes early for 915 appointment. Contact information: [address] [place] [address] [phone]            The results of significant diagnostics from this hospitalization (including imaging, microbiology, ancillary and laboratory) are listed below for reference.    Significant Diagnostic Studies: Dg Chest 2  View  Result Date: 08/19/2018 CLINICAL DATA:  Shortness of breath and lower left chest pain. EXAM: CHEST - 2 VIEW COMPARISON:  PA and lateral chest 06/11/2018 03/15/2015. FINDINGS: Small focus of linear atelectasis on the right. Lungs otherwise clear. Heart size upper normal. No pneumothorax or pleural effusion. No acute or focal bony abnormality. IMPRESSION: No acute disease. Electronically Signed   By: Inge Rise M.D.   On: 08/19/2018 12:02   Vas [place] Lower Extremity Venous (dvt)  Result Date: 08/20/2018  Lower Venous Study Indications: Edema.  Limitations: Poor ultrasound/tissue interface, body habitus, patient pain tolerance and movement. Comparison Study: no prior Performing Technologist: Abram Sander RVS  Examination Guidelines: A complete evaluation includes B-mode imaging, spectral Doppler, color Doppler, and power Doppler as needed of all accessible portions of each vessel. Bilateral testing is considered an integral part of a complete examination. Limited examinations for reoccurring indications may be performed as noted.  +---------+---------------+---------+-----------+----------+--------------+ RIGHT    CompressibilityPhasicitySpontaneityPropertiesSummary        +---------+---------------+---------+-----------+----------+--------------+  CFV      Full           Yes      Yes                                 +---------+---------------+---------+-----------+----------+--------------+ SFJ      Full                                                        +---------+---------------+---------+-----------+----------+--------------+ FV Prox  Full                                                        +---------+---------------+---------+-----------+----------+--------------+ FV Mid                  Yes      Yes                                 +---------+---------------+---------+-----------+----------+--------------+ FV Distal               Yes      Yes                                  +---------+---------------+---------+-----------+----------+--------------+ PFV      Full                                                        +---------+---------------+---------+-----------+----------+--------------+ POP      Full           Yes      Yes                                 +---------+---------------+---------+-----------+----------+--------------+ PTV      Full                                                        +---------+---------------+---------+-----------+----------+--------------+ PERO                                                  Not visualized +---------+---------------+---------+-----------+----------+--------------+     Summary: Right: There is no evidence of deep vein thrombosis in the lower extremity. However, portions of this examination were limited- see technologist comments above. No cystic structure found in the popliteal fossa.  *See table(s) above for measurements and observations. Electronically signed by Curt Jews MD on 08/20/2018 at 11:09:31 AM.    Final     Labs: Basic Metabolic Panel:  Recent Labs  Lab 08/15/18 1108 08/19/18 0321  NA 138 138  K 3.9 4.3  CL 102 104  CO2 27 25  GLUCOSE 129* 147*  BUN 16 9  CREATININE 0.79 0.67  CALCIUM 9.4 8.3*   Liver Function Tests: Recent Labs  Lab 08/19/18 0321  AST 26  ALT 41  ALKPHOS 63  BILITOT 0.4  PROT 7.0  ALBUMIN 3.5    CBC: Recent Labs  Lab 08/15/18 1108 08/18/18 1148 08/19/18 0321 08/20/18 0544  WBC 11.3*  --  10.9* 10.3  NEUTROABS  --   --  8.9* 6.0  HGB 13.6 12.8 12.7 12.7  HCT 43.0 41.0 40.8 41.0  MCV 90.1  --  90.9 91.5  PLT 344  --  328 325    CBG: Recent Labs  Lab 08/18/18 1112 08/18/18 1604  GLUCAP 147* 152*    Principal Problem:   Severe obesity ([place]) Active Problems:   Essential hypertension   Prediabetes   Bilateral chronic knee pain   Hypertriglyceridemia   S/P laparoscopic sleeve gastrectomy   Time  coordinating discharge:15 min  Signed:  Gayland Curry, MD [facility], [place] 08/20/2018, 1:55 PM

## 2018-08-20 NOTE — Progress Notes (Signed)
Discharge instructions given to patient. Patient had no questions. NT or writer will wheel patient out once family comes in

## 2018-08-25 ENCOUNTER — Telehealth (HOSPITAL_COMMUNITY): Payer: Self-pay

## 2018-08-25 NOTE — Telephone Encounter (Signed)
Patient called to discuss questions regarding lovenox, referred to await response [facility] for clarification.  Discussed the following questions as post bariatric surgery follow up.   1.  Tell me about your pain and pain management?has not needed pain medication since discharge from hospital, only complaints with pain is with cough. She does report a productive cough.  She has contacted [facility] to ask about Lovenox and cough  2.  Let's talk about fluid intake.  How much total fluid are you taking in?50 ounces  3.  How much protein have you taken in the last 2 days?50 grams  4.  Have you had nausea?  Tell me about when have experienced nausea and what you did to help?denies nausea except in am she drinks protein shake and goes away  5.  Has the frequency or color changed with your urine?urinating frequently   6.  Tell me what your incisions look like?healing some bruising  7.  Have you been passing gas? BM?had one bm since discharge, reminded to monitor for constipation and use Miralax and MOM for constipation  8.  If a problem or question were to arise who would you call?  Do you know contact numbers for [facility], [facility], and [facility]?aware of how to contact all services  9.  How has the walking going?walking frequently  10.  How are your vitamins and calcium going?  How are you taking them?does not like bariatric vitamin and has stopped taking them she states she is taking a vit c, vitamin d, and b 12 and plans to get otc mvi.  Advised patient of vitamin standards and asked that if she not tolerate the vitamins to reach out to [facility] to discuss options.  Provided contact information.

## 2018-09-02 ENCOUNTER — Other Ambulatory Visit: Payer: Self-pay

## 2018-09-02 ENCOUNTER — Encounter: Payer: BC Managed Care – PPO | Attending: General Surgery | Admitting: Skilled Nursing Facility1

## 2018-09-02 DIAGNOSIS — E669 Obesity, unspecified: Secondary | ICD-10-CM | POA: Diagnosis not present

## 2018-09-02 NOTE — Progress Notes (Signed)
2 Week Post-Operative Nutrition Class   Patient was seen on 03/11/18 for Post-Operative Nutrition education at [facility].    Pt arrived stating why would she need to pay for these appointments when she paid 100 dollars at the beginning: front office staff explained to pt that fee is not billable to insurance and gave pt the sheet explaining what the 100 dollars she paid at her nutrition assessment was for: when front office attempted to schedule her for the 2 month post op pt states "well I am not scheduling another appointment". Pt states the multivitamins make her sick: they are in the capsule form: Dietitian counseled pt on needing to take chewable multivitamins: Pt rebutted nobody told me I was supposed to be doing chewable: Dietitian referred her to her assesment, pre-op class, and discharge paperwork.  Pt states she has been eating solid food for about the past 4 days. Pt states an entire year after surgery is too long to wait to drink alcohol: Dietitian educated the pt on why it is important to wait 1 year after surgery before consuming alcohol.   Surgery date: 08/18/2018 Surgery type: sleeve Start weight at [facility]: 265.4 Weight today: 255.8   Body Composition Scale 09/02/18  Total Body Fat % 47.6  Visceral Fat 19  Fat-Free Mass % 52.3   Total Body Water % 40.6   Muscle-Mass lbs 29.5  Body Fat Displacement          Torso  lbs 75.6         Left Leg  lbs 15.1         Right Leg  lbs 15.1         Left Arm  lbs 7.5         Right Arm   lbs 7.5     The following the learning objectives were met by the patient during this course:  Identifies Phase 3 (Soft, High Proteins) Dietary Goals and will begin from 2 weeks post-operatively to 2 months post-operatively  Identifies appropriate sources of fluids and proteins   States protein recommendations and appropriate sources post-operatively  Identifies the need for appropriate texture modifications,  mastication, and bite sizes when consuming solids  Identifies appropriate multivitamin and calcium sources post-operatively  Describes the need for physical activity post-operatively and will follow MD recommendations  States when to call healthcare provider regarding medication questions or post-operative complications   Handouts given during class include:  Phase 3A: Soft, High Protein Diet Handout   Follow-Up Plan: Patient will follow-up at [facility] in 6 weeks for 2 month post-op nutrition visit for diet advancement per MD.  

## 2018-09-08 ENCOUNTER — Telehealth: Payer: Self-pay | Admitting: Dietician

## 2018-09-08 NOTE — Telephone Encounter (Signed)
I called patient via telephone to assess fluid intake and food tolerance since diet advancement to solid protein foods on 09/02/2018.  Patient unavailable, LVM.     Nat Christen Churchill) Short, MS, RD, LDN

## 2019-12-08 IMAGING — RF UPPER GI SERIES (WITHOUT KUB)
15 of 17 series · 15 of 17 positions shown · non-contrast
Comparison: None.

CLINICAL DATA: Preop bariatric surgery.

EXAM:
UPPER GI SERIES WITH KUB
TECHNIQUE: After obtaining a scout radiograph a routine upper GI series was
performed using thin barium
FLUOROSCOPY TIME:  Fluoroscopy Time:  1 minutes 36 seconds
Radiation Exposure Index (if provided by the fluoroscopic device):
45.3 mGy
Number of Acquired Spot Images: 0

[Series 1: t abdomen supine · 0.15mm/px · 1 of 1 slices shown]
[im 1/1]
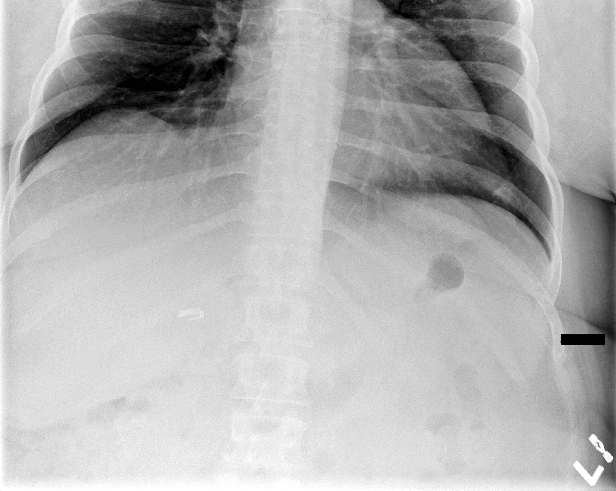

[Series 2: cp_standard · 0.26mm/px · 1 of 1 slices shown (1 of 14)]
[im 1/1]
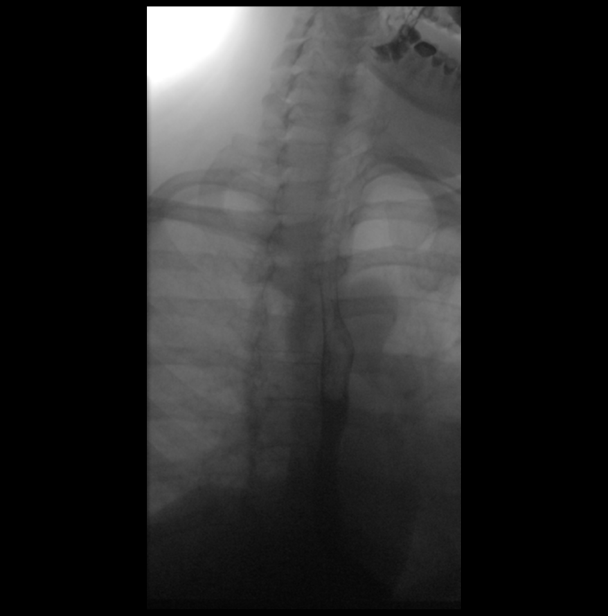

[Series 3: cp_standard · 0.26mm/px · 1 of 1 slices shown (2 of 14)]
[im 1/1]
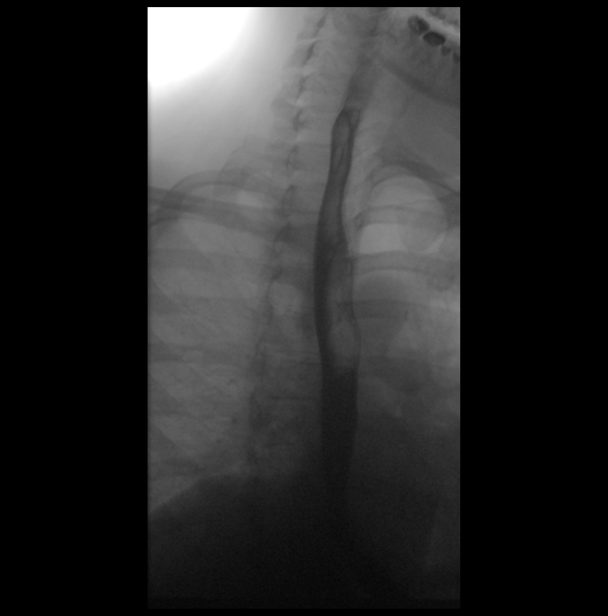

[Series 4: cp_standard · 0.17mm/px · 1 of 1 slices shown (3 of 14)]
[im 1/1]
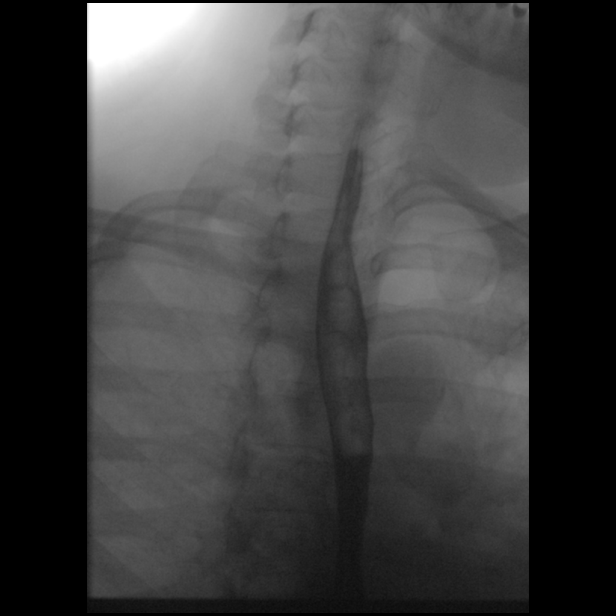

[Series 6: cp_standard · 0.17mm/px · 1 of 1 slices shown (4 of 14)]
[im 1/1]
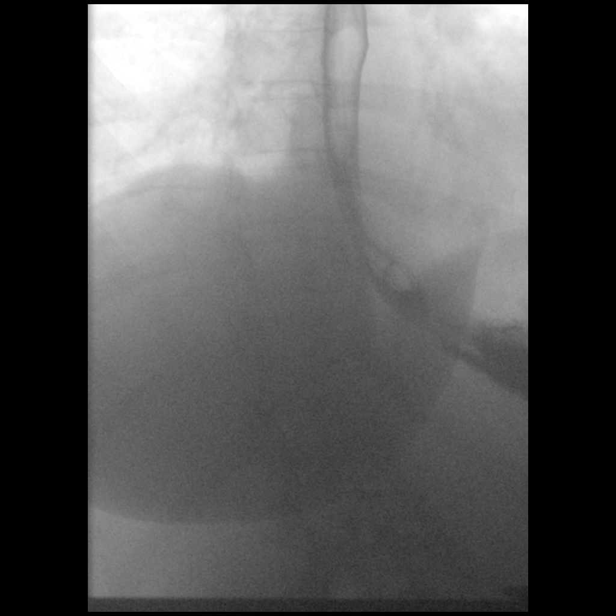

[Series 7: cp_standard · 0.17mm/px · 1 of 1 slices shown (5 of 14)]
[im 1/1]
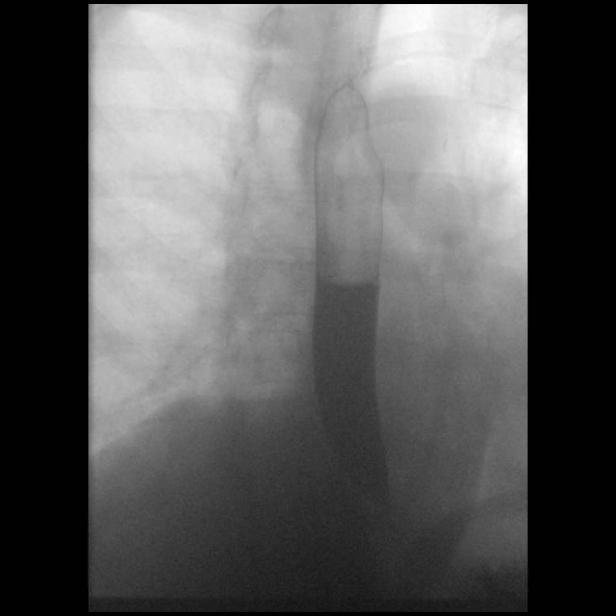

[Series 8: cp_standard · 0.17mm/px · 1 of 1 slices shown (6 of 14)]
[im 1/1]
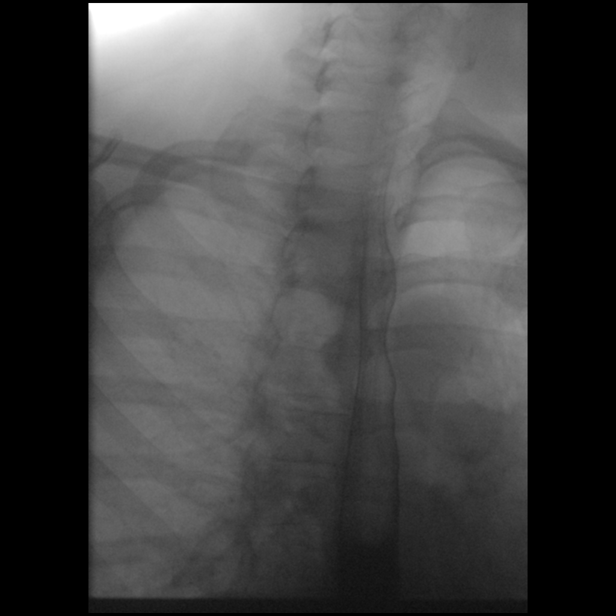

[Series 9: cp_standard · 0.18mm/px · 1 of 1 slices shown (7 of 14)]
[im 1/1]
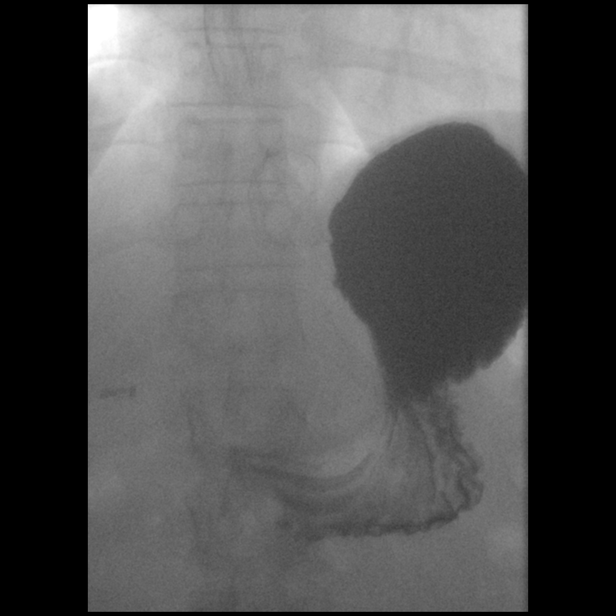

[Series 10: cp_standard · 0.18mm/px · 1 of 1 slices shown (8 of 14)]
[im 1/1]
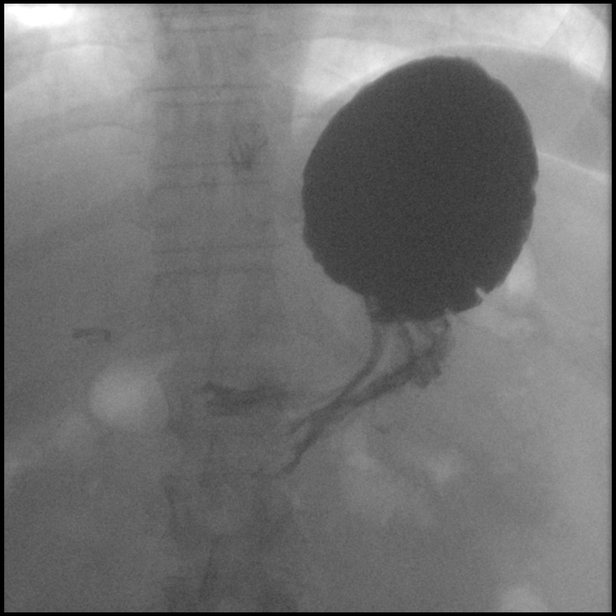

[Series 11: cp_standard · 0.18mm/px · 1 of 1 slices shown (9 of 14)]
[im 1/1]
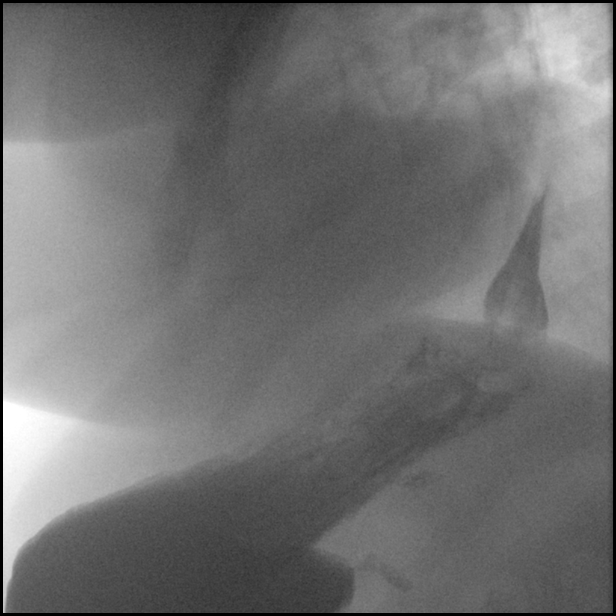

[Series 12: cp_standard · 0.27mm/px · 1 of 1 slices shown (10 of 14)]
[im 1/1]
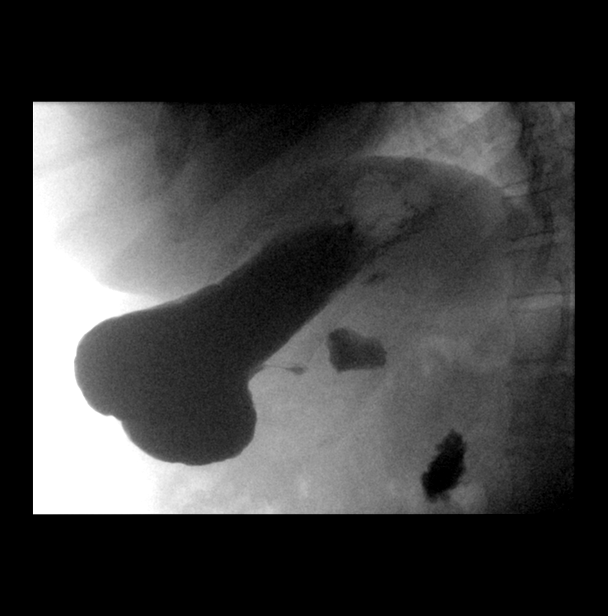

[Series 14: cp_standard · 0.27mm/px · 1 of 1 slices shown (11 of 14)]
[im 1/1]
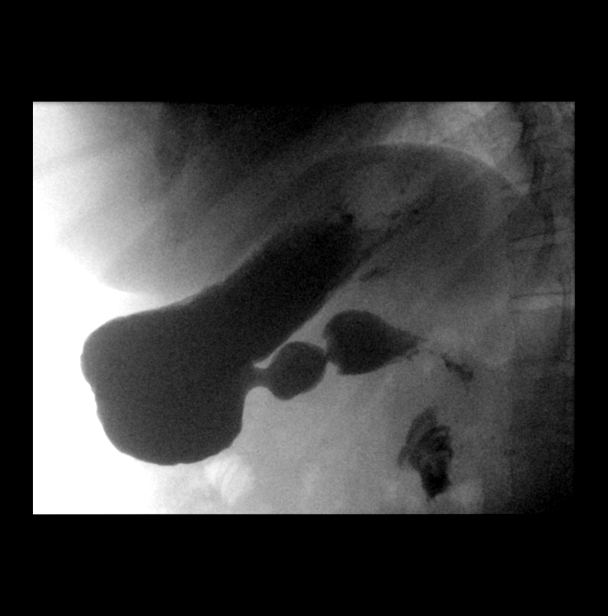

[Series 15: cp_standard · 0.27mm/px · 1 of 1 slices shown (12 of 14)]
[im 1/1]
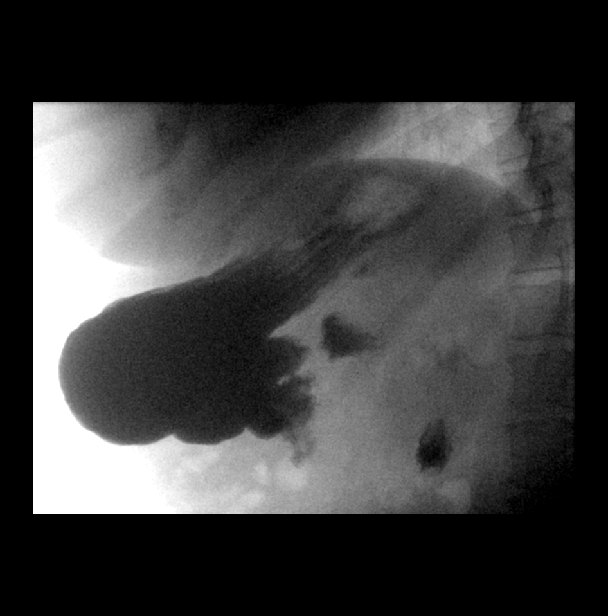

[Series 16: cp_standard · 0.27mm/px · 1 of 1 slices shown (13 of 14)]
[im 1/1]
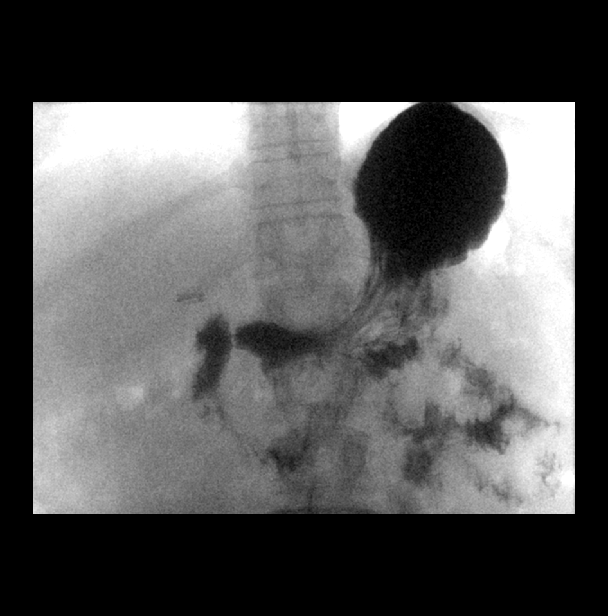

[Series 17: cp_standard · 0.27mm/px · 1 of 1 slices shown (14 of 14)]
[im 1/1]
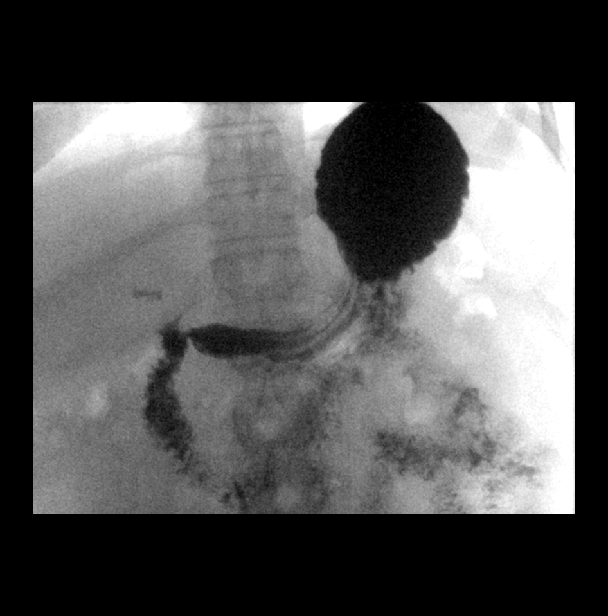

[15 of 17 positions shown; findings below may reference images not displayed]

FINDINGS: Cholecystectomy clips noted in the right upper quadrant of the
abdomen. The visualized bowel loops are nondilated.

The esophagus is patent. No stricture or mass. The stomach has a
normal configuration. The duodenal bulb and C-loop are unremarkable.
No hiatal hernia identified. No reflux.
IMPRESSION: 1. Normal exam.

## 2020-02-15 IMAGING — DX CHEST - 2 VIEW
2 series · 2 of 2 positions shown · non-contrast
Comparison: PA and lateral chest 06/11/2018 03/15/2015.

CLINICAL DATA: Shortness of breath and lower left chest pain.

EXAM:
CHEST - 2 VIEW

[chest pa]
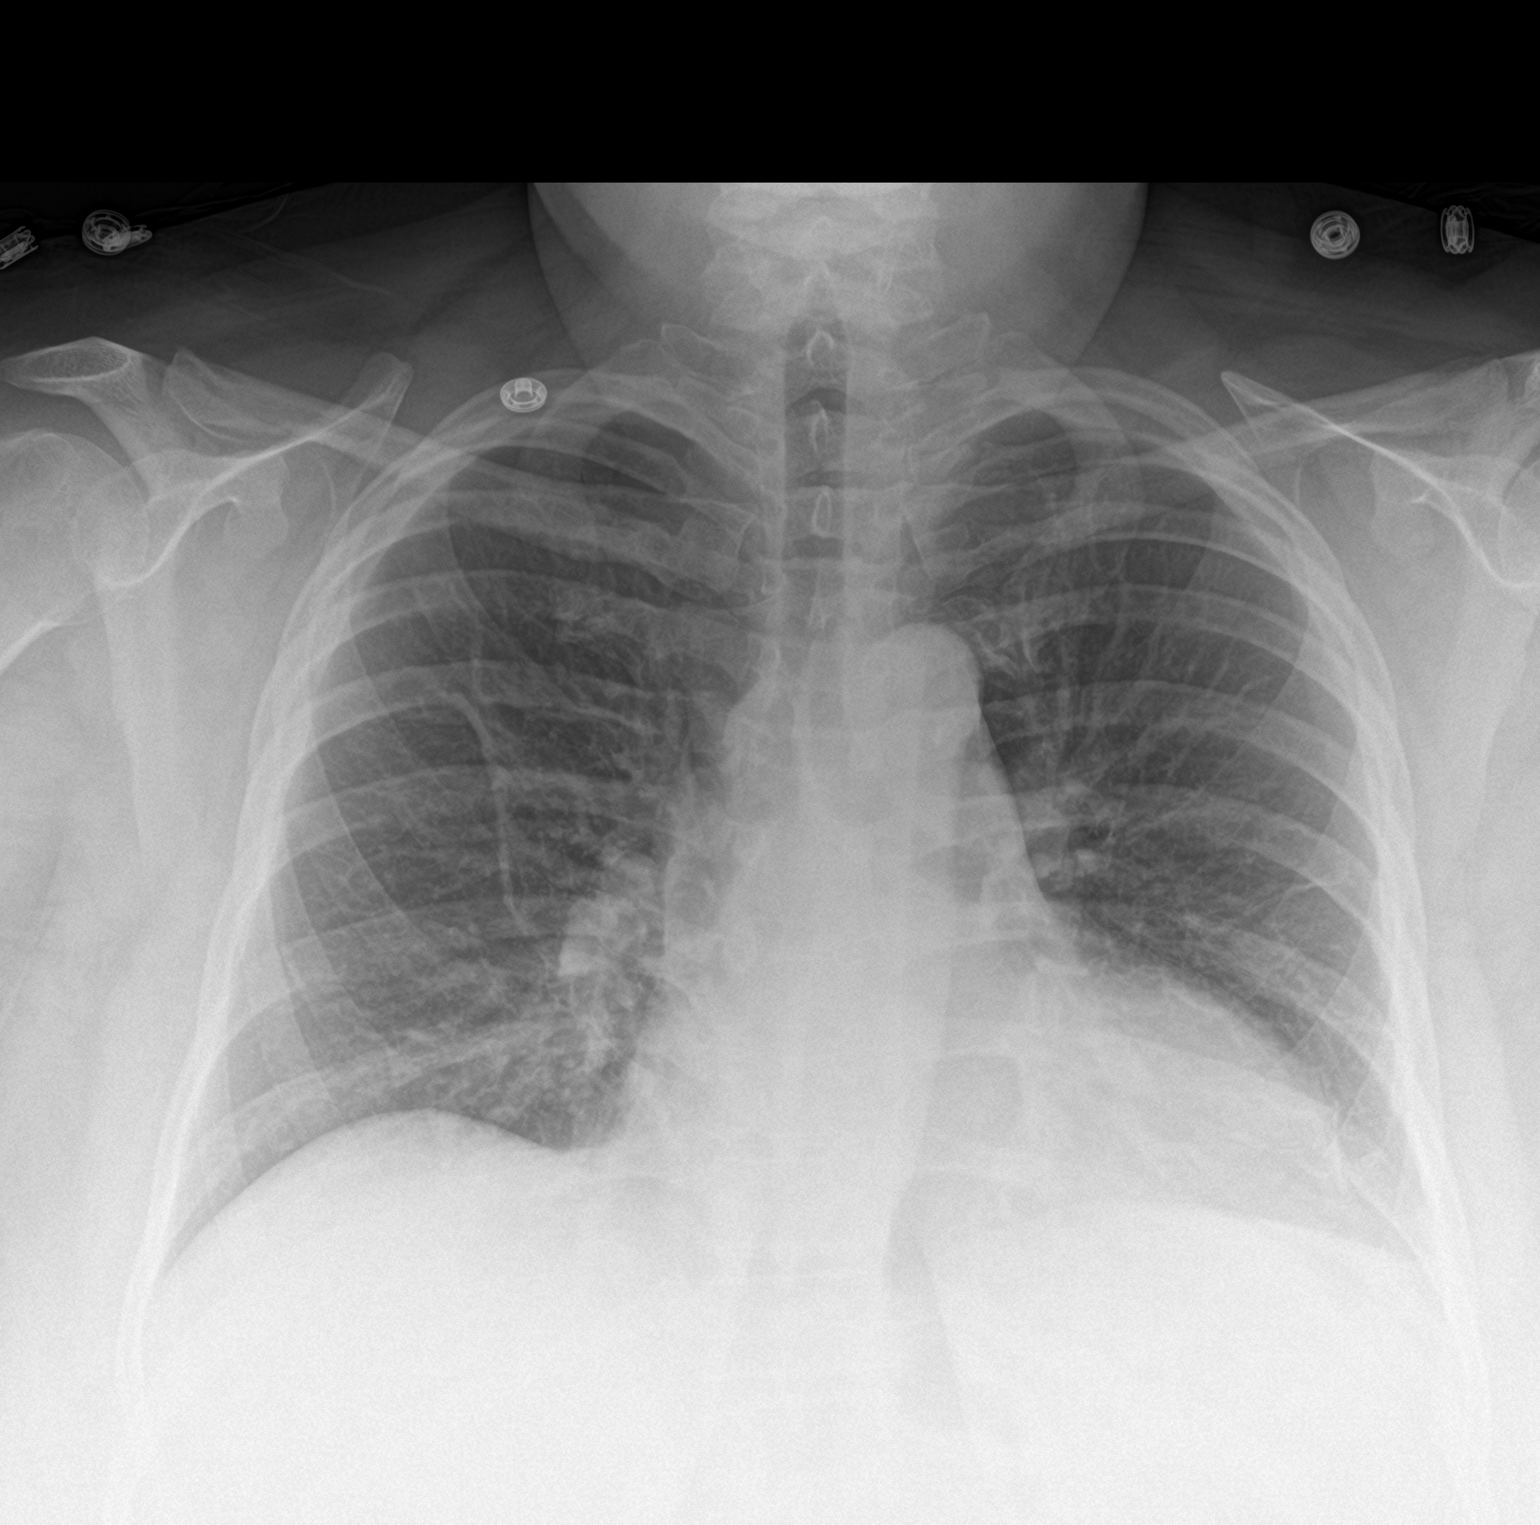

[chest lat]
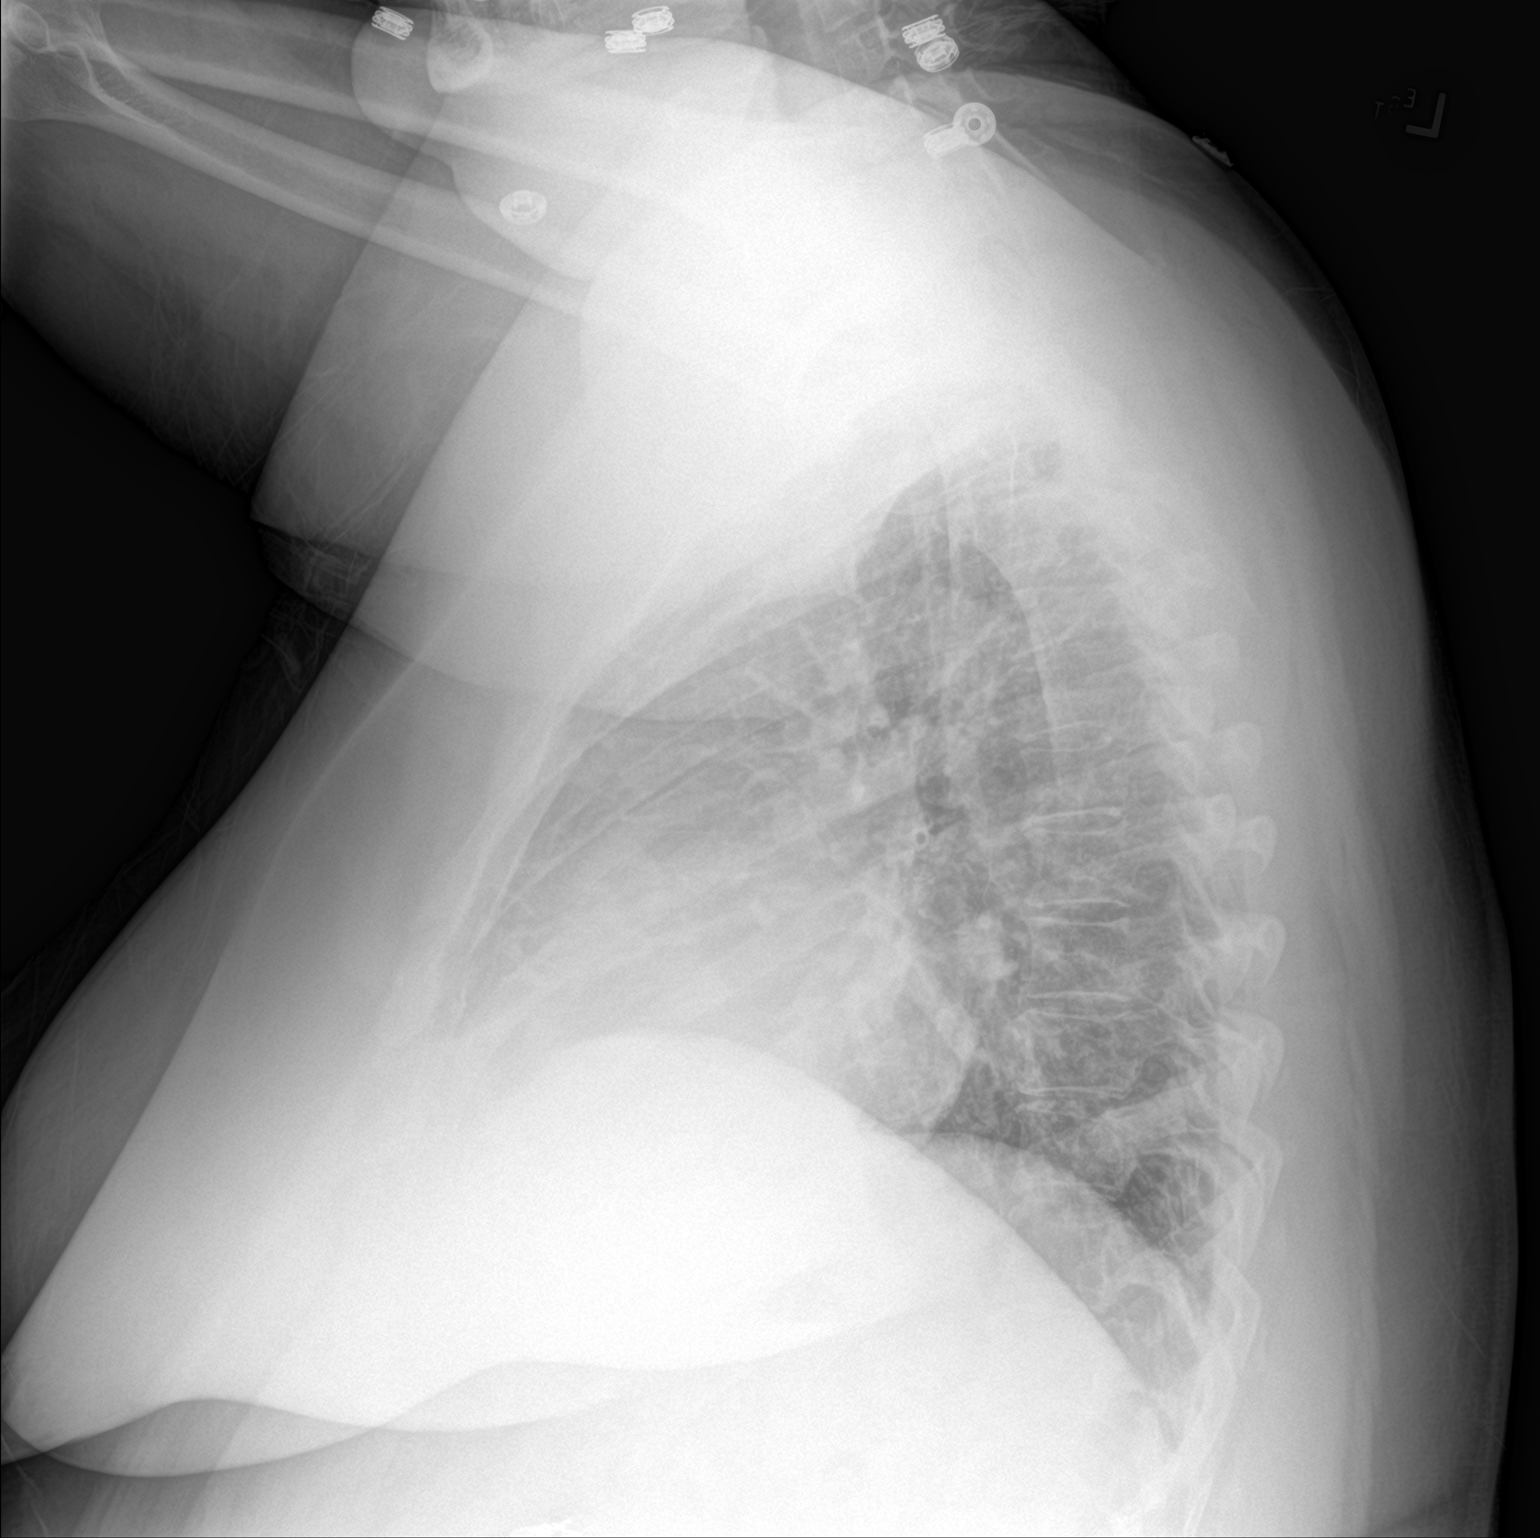

[2 of 2 positions shown; findings below may reference images not displayed]

FINDINGS: Small focus of linear atelectasis on the right. Lungs otherwise
clear. Heart size upper normal. No pneumothorax or pleural effusion.
No acute or focal bony abnormality.
IMPRESSION: No acute disease.

## 2020-03-09 ENCOUNTER — Encounter (HOSPITAL_COMMUNITY): Payer: Self-pay | Admitting: *Deleted

## 2020-11-16 ENCOUNTER — Encounter: Payer: Self-pay | Admitting: Cardiology

## 2020-11-29 ENCOUNTER — Other Ambulatory Visit: Payer: Self-pay

## 2020-11-29 ENCOUNTER — Encounter: Payer: Self-pay | Admitting: Cardiology

## 2020-11-29 ENCOUNTER — Ambulatory Visit (INDEPENDENT_AMBULATORY_CARE_PROVIDER_SITE_OTHER): Payer: 59

## 2020-11-29 ENCOUNTER — Ambulatory Visit (INDEPENDENT_AMBULATORY_CARE_PROVIDER_SITE_OTHER): Payer: 59 | Admitting: Cardiology

## 2020-11-29 VITALS — BP 136/84 | HR 95 | Ht 62.0 in | Wt 245.4 lb

## 2020-11-29 DIAGNOSIS — R002 Palpitations: Secondary | ICD-10-CM

## 2020-11-29 DIAGNOSIS — E785 Hyperlipidemia, unspecified: Secondary | ICD-10-CM | POA: Diagnosis not present

## 2020-11-29 DIAGNOSIS — R42 Dizziness and giddiness: Secondary | ICD-10-CM | POA: Diagnosis not present

## 2020-11-29 DIAGNOSIS — R0609 Other forms of dyspnea: Secondary | ICD-10-CM

## 2020-11-29 DIAGNOSIS — Z87891 Personal history of nicotine dependence: Secondary | ICD-10-CM

## 2020-11-29 DIAGNOSIS — I10 Essential (primary) hypertension: Secondary | ICD-10-CM

## 2020-11-29 NOTE — Progress Notes (Signed)
Cardiology Consultation:    Date:  11/29/2020   ID:  Valerie Lewis, DOB 08-Nov-1975, MRN [id number]  PCP:  Laverle Hobby, NP  Cardiologist:  Jenne Campus, MD   Referring MD: Laverle Hobby, NP   Chief Complaint  Patient presents with   Dizziness    History of Present Illness:    Valerie Lewis is a 45 y.o. female who is being seen today for the evaluation of dizziness at the request of Laverle Hobby, NP.  Past medical history significant for morbid obesity, she did have abdominal sleeve surgery done in 2020, hypertriglyceridemia, prediabetes, essential hypertension.  She was referred to [place] because of episode of dizziness.  Those episodes happening few times a week he get dizzy to the point of falling down however never did fall down.  She never completely passed out.  Those episodes can happen when she sits when she lay down so when she is standing.  She does not feel any palpitations during the time she does not feel any chest pain shortness of breath sweating when it happens.  That episode typically lasts up to a minute.  She is been getting this for about 4 months. She denies have any chest pain tightness squeezing pressure burning chest She does not exercise on the regular basis She did have gastric sleeve surgery done and lost some weight but gained majority of this back. She quit smoking 2 years ago smokes 2 packs/day  Past Medical History:  Diagnosis Date   Obesity    Pre-diabetes     Past Surgical History:  Procedure Laterality Date   CESAREAN SECTION  2006   CHOLECYSTECTOMY     HEMORRHOID SURGERY  2015   LAPAROSCOPIC GASTRIC SLEEVE RESECTION N/A 08/18/2018   Procedure: LAPAROSCOPIC GASTRIC SLEEVE RESECTION, Upper Endo, ERAS Pathway;  Surgeon: [name], MD;  Location: [facility];  Service: General;  Laterality: N/A;    Current Medications: Current Meds  Medication Sig   Cyanocobalamin (B-12 PO) Take 1 tablet by mouth daily. Unknown strength    lisinopril (ZESTRIL) 20 MG tablet Take 20 mg by mouth daily.   traMADol (ULTRAM) 50 MG tablet Take 1 tablet (50 mg total) by mouth every 6 (six) hours as needed for severe pain.     Allergies:   Amoxicillin   Social History   Socioeconomic History   Marital status: Divorced    Spouse name: Not on file   Number of children: Not on file   Years of education: Not on file   Highest education level: Not on file  Occupational History   Not on file  Tobacco Use   Smoking status: Former    Packs/day: 1.00    Years: 10.00    Pack years: 10.00    Types: Cigarettes    Quit date: 06/14/2018    Years since quitting: 2.4   Smokeless tobacco: Never  Vaping Use   Vaping Use: Never used  Substance and Sexual Activity   Alcohol use: Never   Drug use: Never   Sexual activity: Not on file  Other Topics Concern   Not on file  Social History Narrative   Not on file   Social Determinants of Health   Financial Resource Strain: Not on file  Food Insecurity: Not on file  Transportation Needs: Not on file  Physical Activity: Not on file  Stress: Not on file  Social Connections: Not on file     Family History: The patient's family history includes COPD in  her mother. ROS:   Please see the history of present illness.    All 14 point review of systems negative except as described per history of present illness.  EKGs/Labs/Other Studies Reviewed:    The following studies were reviewed today:   EKG:  EKG is  ordered today.  The ekg ordered today demonstrates normal sinus rhythm normal P interval poor R wave progression anterior precordium raising suspicion for old anterior wall myocardial infarction  Recent Labs: No results found for requested labs within last 8760 hours.  Recent Lipid Panel No results found for: CHOL, TRIG, HDL, CHOLHDL, VLDL, LDLCALC, LDLDIRECT  Physical Exam:    VS:  BP 136/84 (BP Location: Right Arm, Patient Position: Sitting)   Pulse 95   Ht 5' 2"  (1.575 m)    Wt 245 lb 6.4 oz (111.3 kg)   SpO2 96%   BMI 44.88 kg/m     Wt Readings from Last 3 Encounters:  11/29/20 245 lb 6.4 oz (111.3 kg)  10/27/20 249 lb (112.9 kg)  09/02/18 255 lb 12.8 oz (116 kg)     GEN:  Well nourished, well developed in no acute distress HEENT: Normal NECK: No JVD; No carotid bruits LYMPHATICS: No lymphadenopathy CARDIAC: RRR, no murmurs, no rubs, no gallops RESPIRATORY:  Clear to auscultation without rales, wheezing or rhonchi  ABDOMEN: Soft, non-tender, non-distended MUSCULOSKELETAL:  No edema; No deformity  SKIN: Warm and dry NEUROLOGIC:  Alert and oriented x 3 PSYCHIATRIC:  Normal affect   ASSESSMENT:    1. Essential hypertension   2. Dizziness   3. Severe obesity ([place])   4. Dyslipidemia   5. History of smoking    PLAN:    In order of problems listed above:  Dizziness concerning and worrisome happening in any position of the body.  No orthostatic.  I will ask her to wear Zio patch AT make sure were not dealing with any significant arrhythmia.  As a part of evaluation I will ask her to have an echocardiogram to assess left ventricle ejection fraction. Abnormal EKG raising suspicion for anterior wall myocardial infarction she does not have any symptomatology suggesting it I will schedule her to have echocardiogram to clarify this. Dyslipidemia I did review her K PN which and data from her primary care physician which show me her LDL being elevated of 142 with triglycerides of 253.  We will continue discussion about potentially starting her therapy for this problem but I will not wait for results of echocardiogram and Zio patch first. History of smoking: I congratulated her for quitting smoking and strongly encouraged her to stay away from it. Severe obesity still a problem in spite of surgery that she had done to reduce her weight.   Medication Adjustments/Labs and Tests Ordered: Current medicines are reviewed at length with the patient today.  Concerns  regarding medicines are outlined above.  No orders of the defined types were placed in this encounter.  No orders of the defined types were placed in this encounter.   Signed, Park Liter, MD, [facility]. 11/29/2020 10:38 AM    [place]

## 2020-11-29 NOTE — Patient Instructions (Signed)
Medication Instructions:  Your physician recommends that you continue on your current medications as directed. Please refer to the Current Medication list given to you today.  *If you need a refill on your cardiac medications before your next appointment, please call your pharmacy*   Lab Work: None If you have labs (blood work) drawn today and your tests are completely normal, you will receive your results only by: [place] (if you have MyChart) OR A paper copy in the mail If you have any lab test that is abnormal or we need to change your treatment, we will call you to review the results.   Testing/Procedures: Your physician has requested that you have an echocardiogram. Echocardiography is a painless test that uses sound waves to create images of your heart. It provides your doctor with information about the size and shape of your heart and how well your heart's chambers and valves are working. This procedure takes approximately one hour. There are no restrictions for this procedure.  A zio monitor was ordered today. It will remain on for 14 days. You will then return monitor and event diary in provided box. It takes 1-2 weeks for report to be downloaded and returned to [place]. We will call you with the results. If monitor falls off or has orange flashing light, please call [employer] for further instructions.     Follow-Up: At [facility], you and your health needs are our priority.  As part of our continuing mission to provide you with exceptional heart care, we have created designated Provider Care Teams.  These Care Teams include your primary Cardiologist (physician) and Advanced Practice Providers (APPs -  Physician Assistants and Nurse Practitioners) who all work together to provide you with the care you need, when you need it.  We recommend signing up for the patient portal called "MyChart".  Sign up information is provided on this After Visit Summary.  MyChart is used to connect with  patients for Virtual Visits (Telemedicine).  Patients are able to view lab/test results, encounter notes, upcoming appointments, etc.  Non-urgent messages can be sent to your provider as well.   To learn more about what you can do with MyChart, go to [email or web address].    Your next appointment:   6 week(s)  The format for your next appointment:   In Person  Provider:   Jenne Campus, MD    Other Instructions  Echocardiogram An echocardiogram is a test that uses sound waves (ultrasound) to produce images of the heart. Images from an echocardiogram can provide important information about: Heart size and shape. The size and thickness and movement of your heart's walls. Heart muscle function and strength. Heart valve function or if you have stenosis. Stenosis is when the heart valves are too narrow. If blood is flowing backward through the heart valves (regurgitation). A tumor or infectious growth around the heart valves. Areas of heart muscle that are not working well because of poor blood flow or injury from a heart attack. Aneurysm detection. An aneurysm is a weak or damaged part of an artery wall. The wall bulges out from the normal force of blood pumping through the body. Tell a health care provider about: Any allergies you have. All medicines you are taking, including vitamins, herbs, eye drops, creams, and over-the-counter medicines. Any blood disorders you have. Any surgeries you have had. Any medical conditions you have. Whether you are pregnant or may be pregnant. What are the risks? Generally, this is a safe test. However,  problems may occur, including an allergic reaction to dye (contrast) that may be used during the test. What happens before the test? No specific preparation is needed. You may eat and drink normally. What happens during the test?  You will take off your clothes from the waist up and put on a hospital gown. Electrodes or electrocardiogram  (ECG)patches may be placed on your chest. The electrodes or patches are then connected to a device that monitors your heart rate and rhythm. You will lie down on a table for an ultrasound exam. A gel will be applied to your chest to help sound waves pass through your skin. A handheld device, called a transducer, will be pressed against your chest and moved over your heart. The transducer produces sound waves that travel to your heart and bounce back (or "echo" back) to the transducer. These sound waves will be captured in real-time and changed into images of your heart that can be viewed on a video monitor. The images will be recorded on a computer and reviewed by your health care provider. You may be asked to change positions or hold your breath for a short time. This makes it easier to get different views or better views of your heart. In some cases, you may receive contrast through an IV in one of your veins. This can improve the quality of the pictures from your heart. The procedure may vary among health care providers and hospitals. What can I expect after the test? You may return to your normal, everyday life, including diet, activities, and medicines, unless your health care provider tells you not to do that. Follow these instructions at home: It is up to you to get the results of your test. Ask your health care provider, or the department that is doing the test, when your results will be ready. Keep all follow-up visits. This is important. Summary An echocardiogram is a test that uses sound waves (ultrasound) to produce images of the heart. Images from an echocardiogram can provide important information about the size and shape of your heart, heart muscle function, heart valve function, and other possible heart problems. You do not need to do anything to prepare before this test. You may eat and drink normally. After the echocardiogram is completed, you may return to your normal, everyday  life, unless your health care provider tells you not to do that. This information is not intended to replace advice given to you by your health care provider. Make sure you discuss any questions you have with your health care provider. Document Revised: 09/14/2020 Document Reviewed: 08/25/2019 Elsevier Patient Education  2022 [employer].

## 2020-11-29 NOTE — Addendum Note (Signed)
Addended by: Senaida Ores on: 11/29/2020 10:48 AM   Modules accepted: Orders

## 2020-11-29 NOTE — Addendum Note (Signed)
Addended by: Senaida Ores on: 11/29/2020 11:03 AM   Modules accepted: Orders

## 2020-12-21 ENCOUNTER — Other Ambulatory Visit: Payer: 59

## 2021-01-17 ENCOUNTER — Ambulatory Visit: Payer: 59 | Admitting: Cardiology
